# Patient Record
Sex: Female | Born: 1980 | Race: White | Hispanic: No | Marital: Married | Smoking: Former smoker
Health system: Southern US, Community
[De-identification: ages and names within clinical notes are randomized; demographics above are authoritative.]

## PROBLEM LIST (undated history)

## (undated) DIAGNOSIS — Z789 Other specified health status: Secondary | ICD-10-CM

---
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
## 2006-05-10 ENCOUNTER — Other Ambulatory Visit: Admission: RE | Admit: 2006-05-10 | Discharge: 2006-05-10 | Payer: Self-pay | Admitting: Obstetrics and Gynecology

## 2007-02-25 ENCOUNTER — Encounter: Admission: RE | Admit: 2007-02-25 | Discharge: 2007-02-25 | Payer: Self-pay | Admitting: Otolaryngology

## 2007-12-31 ENCOUNTER — Encounter: Admission: RE | Admit: 2007-12-31 | Discharge: 2007-12-31 | Payer: Self-pay | Admitting: Emergency Medicine

## 2008-05-05 ENCOUNTER — Encounter: Admission: RE | Admit: 2008-05-05 | Discharge: 2008-05-05 | Payer: Self-pay | Admitting: Emergency Medicine

## 2009-01-21 ENCOUNTER — Encounter: Admission: RE | Admit: 2009-01-21 | Discharge: 2009-01-21 | Payer: Self-pay | Admitting: Gynecology

## 2009-02-04 ENCOUNTER — Encounter: Admission: RE | Admit: 2009-02-04 | Discharge: 2009-02-04 | Payer: Self-pay | Admitting: Internal Medicine

## 2010-01-19 ENCOUNTER — Inpatient Hospital Stay (HOSPITAL_COMMUNITY): Admission: RE | Admit: 2010-01-19 | Discharge: 2010-01-21 | Payer: Self-pay | Admitting: Obstetrics and Gynecology

## 2010-10-23 ENCOUNTER — Encounter: Payer: Self-pay | Admitting: Gastroenterology

## 2010-12-19 LAB — CBC
HCT: 30.1 % — ABNORMAL LOW (ref 36.0–46.0)
HCT: 33.9 % — ABNORMAL LOW (ref 36.0–46.0)
Hemoglobin: 10.2 g/dL — ABNORMAL LOW (ref 12.0–15.0)
Hemoglobin: 11.5 g/dL — ABNORMAL LOW (ref 12.0–15.0)
MCHC: 33.8 g/dL (ref 30.0–36.0)
MCHC: 34 g/dL (ref 30.0–36.0)
MCV: 89 fL (ref 78.0–100.0)
MCV: 89.6 fL (ref 78.0–100.0)
Platelets: 201 10*3/uL (ref 150–400)
Platelets: 249 10*3/uL (ref 150–400)
RBC: 3.36 MIL/uL — ABNORMAL LOW (ref 3.87–5.11)
RBC: 3.81 MIL/uL — ABNORMAL LOW (ref 3.87–5.11)
RDW: 14.2 % (ref 11.5–15.5)
RDW: 14.4 % (ref 11.5–15.5)
WBC: 11.3 10*3/uL — ABNORMAL HIGH (ref 4.0–10.5)
WBC: 9.6 10*3/uL (ref 4.0–10.5)

## 2010-12-19 LAB — COMPREHENSIVE METABOLIC PANEL
ALT: 13 U/L (ref 0–35)
AST: 17 U/L (ref 0–37)
Albumin: 2.6 g/dL — ABNORMAL LOW (ref 3.5–5.2)
Alkaline Phosphatase: 145 U/L — ABNORMAL HIGH (ref 39–117)
BUN: 11 mg/dL (ref 6–23)
CO2: 22 mEq/L (ref 19–32)
Calcium: 9 mg/dL (ref 8.4–10.5)
Chloride: 106 mEq/L (ref 96–112)
Creatinine, Ser: 0.59 mg/dL (ref 0.4–1.2)
GFR calc Af Amer: >60 mL/min (ref >60)
GFR calc non Af Amer: >60 mL/min (ref >60)
Glucose, Bld: 87 mg/dL (ref 70–99)
Potassium: 4.3 mEq/L (ref 3.5–5.1)
Sodium: 136 mEq/L (ref 135–145)
Total Bilirubin: 0.3 mg/dL (ref 0.3–1.2)
Total Protein: 5.7 g/dL — ABNORMAL LOW (ref 6.0–8.3)

## 2010-12-19 LAB — LACTATE DEHYDROGENASE: LDH: 141 U/L (ref 94–250)

## 2010-12-19 LAB — URIC ACID: Uric Acid, Serum: 5.9 mg/dL (ref 2.4–7.0)

## 2010-12-19 LAB — RPR: RPR Ser Ql: NONREACTIVE

## 2010-12-19 LAB — CCBB MATERNAL DONOR DRAW

## 2011-10-02 NOTE — L&D Delivery Note (Signed)
Delivery Note At 12:03 AM a healthy female was delivered via Vaginal, Spontaneous Delivery (Presentation: Left Occiput Anterior).  APGAR: 8, 9; weight pending .   Placenta status: Intact, Spontaneous.  Cord: 3 vessels with the following complications: None.    Anesthesia: Epidural  Episiotomy: None Lacerations: 2nd degree;Perineal Suture Repair: 3.0 vicryl rapide Est. Blood Loss (mL): 350 cc  Mom to postpartum.  Baby to stay with mother.  Oliver Pila 08/23/2012, 12:21 AM

## 2012-02-01 LAB — OB RESULTS CONSOLE ANTIBODY SCREEN: Antibody Screen: NEGATIVE

## 2012-02-01 LAB — OB RESULTS CONSOLE RPR: RPR: NONREACTIVE

## 2012-02-01 LAB — OB RESULTS CONSOLE RUBELLA ANTIBODY, IGM: Rubella: IMMUNE

## 2012-02-01 LAB — OB RESULTS CONSOLE GC/CHLAMYDIA
Chlamydia: NEGATIVE
Gonorrhea: NEGATIVE

## 2012-02-01 LAB — OB RESULTS CONSOLE HIV ANTIBODY (ROUTINE TESTING): HIV: NONREACTIVE

## 2012-02-01 LAB — OB RESULTS CONSOLE HEPATITIS B SURFACE ANTIGEN: Hepatitis B Surface Ag: NEGATIVE

## 2012-02-01 LAB — OB RESULTS CONSOLE ABO/RH: RH Type: POSITIVE

## 2012-07-25 LAB — OB RESULTS CONSOLE GBS: GBS: POSITIVE

## 2012-08-22 ENCOUNTER — Encounter (HOSPITAL_COMMUNITY): Payer: Self-pay | Admitting: Anesthesiology

## 2012-08-22 ENCOUNTER — Encounter (HOSPITAL_COMMUNITY): Payer: Self-pay | Admitting: *Deleted

## 2012-08-22 ENCOUNTER — Inpatient Hospital Stay (HOSPITAL_COMMUNITY): Payer: 59 | Admitting: Anesthesiology

## 2012-08-22 ENCOUNTER — Inpatient Hospital Stay (HOSPITAL_COMMUNITY)
Admission: AD | Admit: 2012-08-22 | Discharge: 2012-08-24 | DRG: 775 | Disposition: A | Discharge status: Home / Self Care | Payer: 59 | Source: Ambulatory Visit | Attending: Obstetrics and Gynecology | Admitting: Obstetrics and Gynecology

## 2012-08-22 DIAGNOSIS — Z22330 Carrier of Group B streptococcus: Secondary | ICD-10-CM

## 2012-08-22 DIAGNOSIS — O99892 Other specified diseases and conditions complicating childbirth: Secondary | ICD-10-CM | POA: Diagnosis present

## 2012-08-22 HISTORY — DX: Other specified health status: Z78.9

## 2012-08-22 LAB — CBC
HCT: 33.2 % — ABNORMAL LOW (ref 36.0–46.0)
Hemoglobin: 11.2 g/dL — ABNORMAL LOW (ref 12.0–15.0)
MCH: 28.8 pg (ref 26.0–34.0)
MCHC: 33.7 g/dL (ref 30.0–36.0)
MCV: 85.3 fL (ref 78.0–100.0)
Platelets: 267 10*3/uL (ref 150–400)
RBC: 3.89 MIL/uL (ref 3.87–5.11)
RDW: 13.6 % (ref 11.5–15.5)
WBC: 14.6 10*3/uL — ABNORMAL HIGH (ref 4.0–10.5)

## 2012-08-22 MED ORDER — DIPHENHYDRAMINE HCL 50 MG/ML IJ SOLN: 12.5000 mg | INTRAMUSCULAR | Status: DC | PRN

## 2012-08-22 MED ORDER — ACETAMINOPHEN 325 MG PO TABS: 650.0000 mg | ORAL_TABLET | ORAL | Status: DC | PRN

## 2012-08-22 MED ORDER — LACTATED RINGERS IV SOLN: 500.0000 mL | INTRAVENOUS | Status: DC | PRN

## 2012-08-22 MED ORDER — FENTANYL 2.5 MCG/ML BUPIVACAINE 1/10 % EPIDURAL INFUSION (WH - ANES)
14.0000 mL/h | INTRAMUSCULAR | Status: DC
  Administered 2012-08-22: 14 mL/h via EPIDURAL
  Filled 2012-08-22: qty 125

## 2012-08-22 MED ORDER — LIDOCAINE HCL (PF) 1 % IJ SOLN
INTRAMUSCULAR | Status: DC | PRN
  Administered 2012-08-22 (×4): 4 mL

## 2012-08-22 MED ORDER — OXYTOCIN 40 UNITS IN LACTATED RINGERS INFUSION - SIMPLE MED
62.5000 mL/h | INTRAVENOUS | Status: DC
  Filled 2012-08-22: qty 1000

## 2012-08-22 MED ORDER — OXYCODONE-ACETAMINOPHEN 5-325 MG PO TABS: 1.0000 | ORAL_TABLET | ORAL | Status: DC | PRN

## 2012-08-22 MED ORDER — ONDANSETRON HCL 4 MG/2ML IJ SOLN: 4.0000 mg | Freq: Four times a day (QID) | INTRAMUSCULAR | Status: DC | PRN

## 2012-08-22 MED ORDER — EPHEDRINE 5 MG/ML INJ: 10.0000 mg | INTRAVENOUS | Status: DC | PRN

## 2012-08-22 MED ORDER — EPHEDRINE 5 MG/ML INJ
10.0000 mg | INTRAVENOUS | Status: DC | PRN
  Filled 2012-08-22: qty 4

## 2012-08-22 MED ORDER — LIDOCAINE HCL (PF) 1 % IJ SOLN
30.0000 mL | INTRAMUSCULAR | Status: DC | PRN
  Filled 2012-08-22: qty 30

## 2012-08-22 MED ORDER — DEXTROSE 5 % IV SOLN
2.5000 10*6.[IU] | INTRAVENOUS | Status: DC
  Filled 2012-08-22 (×5): qty 2.5

## 2012-08-22 MED ORDER — FLEET ENEMA 7-19 GM/118ML RE ENEM: 1.0000 | ENEMA | RECTAL | Status: DC | PRN

## 2012-08-22 MED ORDER — CITRIC ACID-SODIUM CITRATE 334-500 MG/5ML PO SOLN: 30.0000 mL | ORAL | Status: DC | PRN

## 2012-08-22 MED ORDER — PHENYLEPHRINE 40 MCG/ML (10ML) SYRINGE FOR IV PUSH (FOR BLOOD PRESSURE SUPPORT)
80.0000 ug | PREFILLED_SYRINGE | INTRAVENOUS | Status: DC | PRN
  Filled 2012-08-22: qty 5

## 2012-08-22 MED ORDER — PHENYLEPHRINE 40 MCG/ML (10ML) SYRINGE FOR IV PUSH (FOR BLOOD PRESSURE SUPPORT): 80.0000 ug | PREFILLED_SYRINGE | INTRAVENOUS | Status: DC | PRN

## 2012-08-22 MED ORDER — LACTATED RINGERS IV SOLN
500.0000 mL | Freq: Once | INTRAVENOUS | Status: AC
  Administered 2012-08-22: 21:00:00 via INTRAVENOUS

## 2012-08-22 MED ORDER — PENICILLIN G POTASSIUM 5000000 UNITS IJ SOLR
5.0000 10*6.[IU] | Freq: Once | INTRAMUSCULAR | Status: AC
  Administered 2012-08-22: 5 10*6.[IU] via INTRAVENOUS
  Filled 2012-08-22: qty 5

## 2012-08-22 MED ORDER — IBUPROFEN 600 MG PO TABS: 600.0000 mg | ORAL_TABLET | Freq: Four times a day (QID) | ORAL | Status: DC | PRN

## 2012-08-22 MED ORDER — LACTATED RINGERS IV SOLN
INTRAVENOUS | Status: DC
  Administered 2012-08-22: 21:00:00 via INTRAVENOUS

## 2012-08-22 MED ORDER — OXYTOCIN BOLUS FROM INFUSION
500.0000 mL | INTRAVENOUS | Status: DC
  Administered 2012-08-23: 500 mL via INTRAVENOUS

## 2012-08-22 NOTE — Progress Notes (Signed)
   Subjective: Pt comfortable with epidural  Objective: BP 128/75  Pulse 86  Temp 99.1 F (37.3 C) (Oral)  Resp 20  Ht 5\' 4"  (1.626 m)  Wt 90.266 kg (199 lb)  BMI 34.16 kg/m2  SpO2 100%  LMP 11/19/2011      FHT:  FHR: 140 bpm, variability: moderate,  accelerations:  Present,  decelerations:  Absent UC:   regular, every 2 minutes SVE:  C/7-8/-1 AROM clear  Labs: Lab Results  Component Value Date   WBC 14.6* 08/22/2012   HGB 11.2* 08/22/2012   HCT 33.2* 08/22/2012   MCV 85.3 08/22/2012   PLT 267 08/22/2012    Assessment / Plan: Pt progressing, will follow progress. PCN on board for +GBS   Oliver Pila 08/22/2012, 10:43 PM

## 2012-08-22 NOTE — Anesthesia Preprocedure Evaluation (Signed)

## 2012-08-22 NOTE — Anesthesia Procedure Notes (Signed)
Epidural Patient location during procedure: OB Start time: 08/22/2012 9:23 PM  Staffing Performed by: anesthesiologist   Preanesthetic Checklist Completed: patient identified, site marked, surgical consent, pre-op evaluation, timeout performed, IV checked, risks and benefits discussed and monitors and equipment checked  Epidural Patient position: sitting Prep: site prepped and draped and DuraPrep Patient monitoring: continuous pulse ox and blood pressure Approach: midline Injection technique: LOR air  Needle:  Needle type: Tuohy  Needle gauge: 17 G Needle length: 9 cm and 9 Needle insertion depth: 5 cm cm Catheter type: closed end flexible Catheter size: 19 Gauge Catheter at skin depth: 10 cm Test dose: negative  Assessment Events: blood not aspirated, injection not painful, no injection resistance, negative IV test and no paresthesia  Additional Notes Discussed risk of headache, infection, bleeding, nerve injury and failed or incomplete block.  Patient voices understanding and wishes to proceed. Reason for block:procedure for pain

## 2012-08-22 NOTE — H&P (Signed)
Virginia Carter is a 31 y.o. female G2P1001 at 39+ weeks (EDD 08/25/12 by LMP ) presenting for regular contractions and cervical change to 4cm.  Prenatal care uncomplicated except +GBS.  Maternal Medical History:  Reason for admission: Reason for admission: contractions.  Contractions: Onset was 3-5 hours ago.   Frequency: regular.   Perceived severity is strong.    Fetal activity: Perceived fetal activity is normal.      OB History    Grav Para Term Preterm Abortions TAB SAB Ect Mult Living   2 1 1       1     2011 NSVD 7#2oz  Past Medical History  Diagnosis Date  . No pertinent past medical history    Past Surgical History  Procedure Date  . Wisdom tooth extraction 2001   Family History: family history is not on file. Social History:  does not have a smoking history on file. She does not have any smokeless tobacco history on file. Her alcohol and drug histories not on file.   Prenatal Transfer Tool  Maternal Diabetes: No Genetic Screening: Normal Maternal Ultrasounds/Referrals: Normal Fetal Ultrasounds or other Referrals:  None Maternal Substance Abuse:  No Significant Maternal Medications:  None Significant Maternal Lab Results:  Lab values include: Group B Strep positive Other Comments:  None  ROS  Dilation: 4 Effacement (%): 80 Station: -2 Exam by:: Danella Deis, RNC-OB Temperature 99.1 F (37.3 C), temperature source Oral, last menstrual period 11/19/2011. Maternal Exam:  Uterine Assessment: Contraction strength is firm.  Contraction frequency is regular.   Abdomen: Patient reports no abdominal tenderness. Fetal presentation: vertex  Introitus: Normal vulva. Normal vagina.    Physical Exam  Constitutional: She is oriented to person, place, and time. She appears well-developed and well-nourished.  Cardiovascular: Normal rate and regular rhythm.   Respiratory: Effort normal and breath sounds normal.  GI: Soft. Bowel sounds are normal.  Genitourinary: Vagina  normal and uterus normal.  Neurological: She is alert and oriented to person, place, and time.  Psychiatric: She has a normal mood and affect. Her behavior is normal.    Prenatal labs: ABO, Rh: A/Positive/-- (05/03 0000) Antibody: Negative (05/03 0000) Rubella: Immune (05/03 0000) RPR: Nonreactive (05/03 0000)  HBsAg: Negative (05/03 0000)  HIV: Non-reactive (05/03 0000)  GBS: Positive (10/25 0000)  One hour GCT 138 Three hour GTT WNL First trimester screen and AFP WNL  Assessment/Plan: Pt admitted with contractions and cervical change to 4cm.  GBS positive, will get PCN prophylaxis.  Desires epidural.   Oliver Pila 08/22/2012, 8:46 PM

## 2012-08-22 NOTE — OB Triage Note (Signed)
Pt c/o ctx every 2 min for 30-40 sec since 4pm today, denies bleeding or leaking of fluid, states baby has been active. Pt states she was 2cm/50% in the office today.

## 2012-08-23 ENCOUNTER — Encounter (HOSPITAL_COMMUNITY): Payer: Self-pay | Admitting: *Deleted

## 2012-08-23 LAB — CBC
HCT: 29.7 % — ABNORMAL LOW (ref 36.0–46.0)
Hemoglobin: 9.9 g/dL — ABNORMAL LOW (ref 12.0–15.0)
MCH: 28.6 pg (ref 26.0–34.0)
MCHC: 33.3 g/dL (ref 30.0–36.0)
MCV: 85.8 fL (ref 78.0–100.0)
Platelets: 224 10*3/uL (ref 150–400)
RBC: 3.46 MIL/uL — ABNORMAL LOW (ref 3.87–5.11)
RDW: 13.7 % (ref 11.5–15.5)
WBC: 12.5 10*3/uL — ABNORMAL HIGH (ref 4.0–10.5)

## 2012-08-23 LAB — TYPE AND SCREEN
ABO/RH(D): A POS
Antibody Screen: NEGATIVE

## 2012-08-23 LAB — RPR: RPR Ser Ql: NONREACTIVE

## 2012-08-23 LAB — ABO/RH: ABO/RH(D): A POS

## 2012-08-23 MED ORDER — TETANUS-DIPHTH-ACELL PERTUSSIS 5-2.5-18.5 LF-MCG/0.5 IM SUSP: 0.5000 mL | Freq: Once | INTRAMUSCULAR | Status: DC

## 2012-08-23 MED ORDER — DIPHENHYDRAMINE HCL 25 MG PO CAPS: 25.0000 mg | ORAL_CAPSULE | Freq: Four times a day (QID) | ORAL | Status: DC | PRN

## 2012-08-23 MED ORDER — ZOLPIDEM TARTRATE 5 MG PO TABS: 5.0000 mg | ORAL_TABLET | Freq: Every evening | ORAL | Status: DC | PRN

## 2012-08-23 MED ORDER — OXYCODONE-ACETAMINOPHEN 5-325 MG PO TABS
1.0000 | ORAL_TABLET | ORAL | Status: DC | PRN
  Administered 2012-08-23 – 2012-08-24 (×2): 1 via ORAL
  Filled 2012-08-23 (×2): qty 1

## 2012-08-23 MED ORDER — LANOLIN HYDROUS EX OINT: TOPICAL_OINTMENT | CUTANEOUS | Status: DC | PRN

## 2012-08-23 MED ORDER — IBUPROFEN 600 MG PO TABS
600.0000 mg | ORAL_TABLET | Freq: Four times a day (QID) | ORAL | Status: DC
  Administered 2012-08-23 – 2012-08-24 (×5): 600 mg via ORAL
  Filled 2012-08-23 (×5): qty 1

## 2012-08-23 MED ORDER — BENZOCAINE-MENTHOL 20-0.5 % EX AERO
1.0000 "application " | INHALATION_SPRAY | CUTANEOUS | Status: DC | PRN
  Administered 2012-08-23: 1 via TOPICAL
  Filled 2012-08-23: qty 56

## 2012-08-23 MED ORDER — ONDANSETRON HCL 4 MG/2ML IJ SOLN: 4.0000 mg | INTRAMUSCULAR | Status: DC | PRN

## 2012-08-23 MED ORDER — DIBUCAINE 1 % RE OINT: 1.0000 "application " | TOPICAL_OINTMENT | RECTAL | Status: DC | PRN

## 2012-08-23 MED ORDER — PRENATAL MULTIVITAMIN CH
1.0000 | ORAL_TABLET | Freq: Every day | ORAL | Status: DC
  Administered 2012-08-23 – 2012-08-24 (×2): 1 via ORAL
  Filled 2012-08-23 (×2): qty 1

## 2012-08-23 MED ORDER — SIMETHICONE 80 MG PO CHEW: 80.0000 mg | CHEWABLE_TABLET | ORAL | Status: DC | PRN

## 2012-08-23 MED ORDER — ONDANSETRON HCL 4 MG PO TABS: 4.0000 mg | ORAL_TABLET | ORAL | Status: DC | PRN

## 2012-08-23 MED ORDER — SENNOSIDES-DOCUSATE SODIUM 8.6-50 MG PO TABS
2.0000 | ORAL_TABLET | Freq: Every day | ORAL | Status: DC
  Administered 2012-08-23: 2 via ORAL

## 2012-08-23 MED ORDER — WITCH HAZEL-GLYCERIN EX PADS: 1.0000 "application " | MEDICATED_PAD | CUTANEOUS | Status: DC | PRN

## 2012-08-23 NOTE — Clinical Social Work Note (Signed)
CSW spoke with MOB briefly, MOB reports she was on Lexapro before pregnancy and plans to restart this.  No current emotion concerns.   Patient was referred for history of depression/anxiety.  * Referral screened out by Clinical Social Worker because none of the following criteria appear to apply: ~ History of anxiety/depression during this pregnancy, or of post-partum depression. ~ Diagnosis of anxiety and/or depression within last 3 years ~ History of depression due to pregnancy loss/loss of child  OR  * Patient's symptoms currently being treated with medication and/or therapy.  Please contact the Clinical Social Worker if needs arise, or by the patient's request.

## 2012-08-23 NOTE — Anesthesia Postprocedure Evaluation (Signed)
  Anesthesia Post-op Note  Patient: Virginia Carter  Procedure(s) Performed: * No procedures listed *  Patient Location: Mother/Baby  Anesthesia Type:Epidural  Level of Consciousness: awake, alert  and oriented  Airway and Oxygen Therapy: Patient Spontanous Breathing  Post-op Pain: mild  Post-op Assessment: Patient's Cardiovascular Status Stable, Respiratory Function Stable, Patent Airway, No signs of Nausea or vomiting and Pain level controlled  Post-op Vital Signs: stable  Complications: No apparent anesthesia complications

## 2012-08-23 NOTE — Progress Notes (Signed)
Post Partum Day 0 Subjective: no complaints, up ad lib and tolerating PO  Objective: Blood pressure 103/61, pulse 87, temperature 97.7 F (36.5 C), temperature source Oral, resp. rate 18, height 5\' 4"  (1.626 m), weight 90.266 kg (199 lb), last menstrual period 11/19/2011, SpO2 100.00%.  Physical Exam:  General: alert and cooperative Lochia: appropriate Uterine Fundus: firm    Basename 08/23/12 0500 08/22/12 2030  HGB 9.9* 11.2*  HCT 29.7* 33.2*    Assessment/Plan: Plan for discharge tomorrow per pt request D/w pt circumcision in detail, desires tomorrow   LOS: 1 day   Virginia Carter W 08/23/2012, 9:34 AM

## 2012-08-24 MED ORDER — OXYCODONE-ACETAMINOPHEN 5-325 MG PO TABS: 1.0000 | ORAL_TABLET | ORAL | Status: DC | PRN

## 2012-08-24 MED ORDER — IBUPROFEN 600 MG PO TABS: 600.0000 mg | ORAL_TABLET | Freq: Four times a day (QID) | ORAL | Status: DC

## 2012-08-24 NOTE — Discharge Summary (Signed)
Obstetric Discharge Summary Reason for Admission: onset of labor Prenatal Procedures: none Intrapartum Procedures: spontaneous vaginal delivery Postpartum Procedures: none Complications-Operative and Postpartum: small second degree perineal laceration Hemoglobin  Date Value Range Status  08/23/2012 9.9* 12.0 - 15.0 g/dL Final     HCT  Date Value Range Status  08/23/2012 29.7* 36.0 - 46.0 % Final    Physical Exam:  General: alert and cooperative Lochia: appropriate Uterine Fundus: firm   Discharge Diagnoses: Term Pregnancy-delivered  Discharge Information: Date: 08/24/2012 Activity: pelvic rest Diet: routine Medications: Ibuprofen and Percocet Condition: improved Instructions: refer to practice specific booklet Discharge to: home Follow-up Information    Follow up with Virginia Pila, MD. Schedule an appointment as soon as possible for a visit in 6 weeks.   Contact information:   [address], [address] [place] [address] [phone]          Newborn Data: Live born female  Birth Weight: 8 lb 0.6 oz (3646 g) APGAR: 8, 9  Home with mother.  Virginia Carter 08/24/2012, 10:30 AM

## 2012-08-24 NOTE — Progress Notes (Signed)
Post Partum Day 2 Subjective: no complaints, up ad lib and tolerating PO  Objective: Blood pressure 120/76, pulse 66, temperature 98.1 F (36.7 C), temperature source Oral, resp. rate 18, height 5\' 4"  (1.626 m), weight 90.266 kg (199 lb), last menstrual period 11/19/2011, SpO2 100.00%.  Physical Exam:  General: alert and cooperative Lochia: appropriate Uterine Fundus: firm    Basename 08/23/12 0500 08/22/12 2030  HGB 9.9* 11.2*  HCT 29.7* 33.2*    Assessment/Plan: Discharge home Motrin and percocet   LOS: 2 days   Daelynn Blower W 08/24/2012, 10:25 AM

## 2012-08-25 ENCOUNTER — Encounter (HOSPITAL_COMMUNITY): Payer: Self-pay | Admitting: *Deleted

## 2012-08-26 ENCOUNTER — Inpatient Hospital Stay (HOSPITAL_COMMUNITY): Admission: RE | Admit: 2012-08-26 | Payer: 59 | Source: Ambulatory Visit

## 2013-02-25 ENCOUNTER — Emergency Department: Payer: Self-pay | Admitting: Internal Medicine

## 2013-03-05 ENCOUNTER — Other Ambulatory Visit (HOSPITAL_COMMUNITY): Payer: Self-pay | Admitting: Neurosurgery

## 2013-03-05 DIAGNOSIS — M5126 Other intervertebral disc displacement, lumbar region: Secondary | ICD-10-CM

## 2013-03-09 ENCOUNTER — Ambulatory Visit (HOSPITAL_COMMUNITY): Payer: 59

## 2013-03-10 ENCOUNTER — Ambulatory Visit (HOSPITAL_COMMUNITY)
Admission: RE | Admit: 2013-03-10 | Discharge: 2013-03-10 | Disposition: A | Discharge status: Home / Self Care | Payer: 59 | Source: Ambulatory Visit | Attending: Neurosurgery | Admitting: Neurosurgery

## 2013-03-10 DIAGNOSIS — M5126 Other intervertebral disc displacement, lumbar region: Secondary | ICD-10-CM

## 2013-03-10 DIAGNOSIS — M545 Low back pain, unspecified: Secondary | ICD-10-CM | POA: Insufficient documentation

## 2013-03-10 DIAGNOSIS — M5137 Other intervertebral disc degeneration, lumbosacral region: Secondary | ICD-10-CM | POA: Insufficient documentation

## 2013-03-10 DIAGNOSIS — M25559 Pain in unspecified hip: Secondary | ICD-10-CM | POA: Insufficient documentation

## 2013-03-10 DIAGNOSIS — M51379 Other intervertebral disc degeneration, lumbosacral region without mention of lumbar back pain or lower extremity pain: Secondary | ICD-10-CM | POA: Insufficient documentation

## 2013-08-07 ENCOUNTER — Ambulatory Visit: Payer: Self-pay

## 2014-08-02 ENCOUNTER — Encounter (HOSPITAL_COMMUNITY): Payer: Self-pay | Admitting: *Deleted

## 2015-12-30 DIAGNOSIS — Z0000 Encounter for general adult medical examination without abnormal findings: Secondary | ICD-10-CM | POA: Diagnosis not present

## 2016-01-06 DIAGNOSIS — Z0000 Encounter for general adult medical examination without abnormal findings: Secondary | ICD-10-CM | POA: Diagnosis not present

## 2016-01-06 DIAGNOSIS — N390 Urinary tract infection, site not specified: Secondary | ICD-10-CM | POA: Diagnosis not present

## 2016-01-06 DIAGNOSIS — K219 Gastro-esophageal reflux disease without esophagitis: Secondary | ICD-10-CM | POA: Diagnosis not present

## 2016-01-06 DIAGNOSIS — M4302 Spondylolysis, cervical region: Secondary | ICD-10-CM | POA: Diagnosis not present

## 2016-01-06 DIAGNOSIS — E663 Overweight: Secondary | ICD-10-CM | POA: Diagnosis not present

## 2016-01-06 DIAGNOSIS — R8299 Other abnormal findings in urine: Secondary | ICD-10-CM | POA: Diagnosis not present

## 2016-01-06 DIAGNOSIS — F418 Other specified anxiety disorders: Secondary | ICD-10-CM | POA: Diagnosis not present

## 2016-06-22 DIAGNOSIS — Z30432 Encounter for removal of intrauterine contraceptive device: Secondary | ICD-10-CM | POA: Diagnosis not present

## 2016-06-22 DIAGNOSIS — N906 Unspecified hypertrophy of vulva: Secondary | ICD-10-CM | POA: Diagnosis not present

## 2016-06-22 DIAGNOSIS — Z130 Encounter for screening for diseases of the blood and blood-forming organs and certain disorders involving the immune mechanism: Secondary | ICD-10-CM | POA: Diagnosis not present

## 2016-06-22 DIAGNOSIS — Z6828 Body mass index (BMI) 28.0-28.9, adult: Secondary | ICD-10-CM | POA: Diagnosis not present

## 2016-06-22 DIAGNOSIS — Z01419 Encounter for gynecological examination (general) (routine) without abnormal findings: Secondary | ICD-10-CM | POA: Diagnosis not present

## 2016-06-22 DIAGNOSIS — Z1389 Encounter for screening for other disorder: Secondary | ICD-10-CM | POA: Diagnosis not present

## 2016-06-26 DIAGNOSIS — L732 Hidradenitis suppurativa: Secondary | ICD-10-CM | POA: Diagnosis not present

## 2016-06-26 DIAGNOSIS — L810 Postinflammatory hyperpigmentation: Secondary | ICD-10-CM | POA: Diagnosis not present

## 2016-08-10 ENCOUNTER — Telehealth: Payer: 59 | Admitting: Family

## 2016-08-10 DIAGNOSIS — B9689 Other specified bacterial agents as the cause of diseases classified elsewhere: Secondary | ICD-10-CM

## 2016-08-10 DIAGNOSIS — J329 Chronic sinusitis, unspecified: Secondary | ICD-10-CM | POA: Diagnosis not present

## 2016-08-10 MED ORDER — AMOXICILLIN-POT CLAVULANATE 875-125 MG PO TABS: 1.0000 | ORAL_TABLET | Freq: Two times a day (BID) | ORAL | 0 refills | Status: AC

## 2016-08-10 NOTE — Progress Notes (Signed)

## 2016-09-14 DIAGNOSIS — H5213 Myopia, bilateral: Secondary | ICD-10-CM | POA: Diagnosis not present

## 2016-09-14 DIAGNOSIS — H52223 Regular astigmatism, bilateral: Secondary | ICD-10-CM | POA: Diagnosis not present

## 2016-12-11 DIAGNOSIS — O200 Threatened abortion: Secondary | ICD-10-CM | POA: Diagnosis not present

## 2016-12-13 DIAGNOSIS — Z3201 Encounter for pregnancy test, result positive: Secondary | ICD-10-CM | POA: Diagnosis not present

## 2016-12-21 DIAGNOSIS — O021 Missed abortion: Secondary | ICD-10-CM | POA: Diagnosis not present

## 2017-01-11 DIAGNOSIS — Z0000 Encounter for general adult medical examination without abnormal findings: Secondary | ICD-10-CM | POA: Diagnosis not present

## 2017-01-17 DIAGNOSIS — Z6828 Body mass index (BMI) 28.0-28.9, adult: Secondary | ICD-10-CM | POA: Diagnosis not present

## 2017-01-17 DIAGNOSIS — F418 Other specified anxiety disorders: Secondary | ICD-10-CM | POA: Diagnosis not present

## 2017-01-17 DIAGNOSIS — Z0000 Encounter for general adult medical examination without abnormal findings: Secondary | ICD-10-CM | POA: Diagnosis not present

## 2017-01-17 DIAGNOSIS — Z1389 Encounter for screening for other disorder: Secondary | ICD-10-CM | POA: Diagnosis not present

## 2017-01-17 DIAGNOSIS — K219 Gastro-esophageal reflux disease without esophagitis: Secondary | ICD-10-CM | POA: Diagnosis not present

## 2017-01-17 DIAGNOSIS — M4302 Spondylolysis, cervical region: Secondary | ICD-10-CM | POA: Diagnosis not present

## 2017-01-17 DIAGNOSIS — E663 Overweight: Secondary | ICD-10-CM | POA: Diagnosis not present

## 2017-01-18 DIAGNOSIS — Z0000 Encounter for general adult medical examination without abnormal findings: Secondary | ICD-10-CM | POA: Diagnosis not present

## 2017-01-24 DIAGNOSIS — N926 Irregular menstruation, unspecified: Secondary | ICD-10-CM | POA: Diagnosis not present

## 2017-01-28 DIAGNOSIS — Z319 Encounter for procreative management, unspecified: Secondary | ICD-10-CM | POA: Diagnosis not present

## 2017-03-01 DIAGNOSIS — N926 Irregular menstruation, unspecified: Secondary | ICD-10-CM | POA: Diagnosis not present

## 2017-03-05 DIAGNOSIS — N926 Irregular menstruation, unspecified: Secondary | ICD-10-CM | POA: Diagnosis not present

## 2017-03-14 DIAGNOSIS — N911 Secondary amenorrhea: Secondary | ICD-10-CM | POA: Diagnosis not present

## 2017-03-14 DIAGNOSIS — Z3201 Encounter for pregnancy test, result positive: Secondary | ICD-10-CM | POA: Diagnosis not present

## 2017-03-25 DIAGNOSIS — O26891 Other specified pregnancy related conditions, first trimester: Secondary | ICD-10-CM | POA: Diagnosis not present

## 2017-03-25 DIAGNOSIS — Z3A01 Less than 8 weeks gestation of pregnancy: Secondary | ICD-10-CM | POA: Diagnosis not present

## 2017-04-12 DIAGNOSIS — Z3689 Encounter for other specified antenatal screening: Secondary | ICD-10-CM | POA: Diagnosis not present

## 2017-04-12 DIAGNOSIS — Z1379 Encounter for other screening for genetic and chromosomal anomalies: Secondary | ICD-10-CM | POA: Diagnosis not present

## 2017-04-12 DIAGNOSIS — O99340 Other mental disorders complicating pregnancy, unspecified trimester: Secondary | ICD-10-CM | POA: Diagnosis not present

## 2017-04-12 DIAGNOSIS — O09521 Supervision of elderly multigravida, first trimester: Secondary | ICD-10-CM | POA: Diagnosis not present

## 2017-04-12 DIAGNOSIS — Z3A10 10 weeks gestation of pregnancy: Secondary | ICD-10-CM | POA: Diagnosis not present

## 2017-04-12 DIAGNOSIS — Z113 Encounter for screening for infections with a predominantly sexual mode of transmission: Secondary | ICD-10-CM | POA: Diagnosis not present

## 2017-04-24 DIAGNOSIS — Z368A Encounter for antenatal screening for other genetic defects: Secondary | ICD-10-CM | POA: Diagnosis not present

## 2017-04-26 DIAGNOSIS — Z3682 Encounter for antenatal screening for nuchal translucency: Secondary | ICD-10-CM | POA: Diagnosis not present

## 2017-04-26 DIAGNOSIS — Z3A12 12 weeks gestation of pregnancy: Secondary | ICD-10-CM | POA: Diagnosis not present

## 2017-06-13 DIAGNOSIS — Z3A19 19 weeks gestation of pregnancy: Secondary | ICD-10-CM | POA: Diagnosis not present

## 2017-06-13 DIAGNOSIS — Z363 Encounter for antenatal screening for malformations: Secondary | ICD-10-CM | POA: Diagnosis not present

## 2017-08-16 DIAGNOSIS — Z3689 Encounter for other specified antenatal screening: Secondary | ICD-10-CM | POA: Diagnosis not present

## 2017-08-16 DIAGNOSIS — O09523 Supervision of elderly multigravida, third trimester: Secondary | ICD-10-CM | POA: Diagnosis not present

## 2017-08-16 DIAGNOSIS — Z23 Encounter for immunization: Secondary | ICD-10-CM | POA: Diagnosis not present

## 2017-08-16 DIAGNOSIS — Z3A28 28 weeks gestation of pregnancy: Secondary | ICD-10-CM | POA: Diagnosis not present

## 2017-09-20 DIAGNOSIS — H52223 Regular astigmatism, bilateral: Secondary | ICD-10-CM | POA: Diagnosis not present

## 2017-09-20 DIAGNOSIS — H5213 Myopia, bilateral: Secondary | ICD-10-CM | POA: Diagnosis not present

## 2017-10-03 DIAGNOSIS — O99345 Other mental disorders complicating the puerperium: Secondary | ICD-10-CM | POA: Diagnosis not present

## 2017-10-03 DIAGNOSIS — Z3A35 35 weeks gestation of pregnancy: Secondary | ICD-10-CM | POA: Diagnosis not present

## 2017-10-03 DIAGNOSIS — Z3685 Encounter for antenatal screening for Streptococcus B: Secondary | ICD-10-CM | POA: Diagnosis not present

## 2017-10-03 DIAGNOSIS — O09523 Supervision of elderly multigravida, third trimester: Secondary | ICD-10-CM | POA: Diagnosis not present

## 2017-10-03 DIAGNOSIS — M549 Dorsalgia, unspecified: Secondary | ICD-10-CM | POA: Diagnosis not present

## 2017-10-03 DIAGNOSIS — G43909 Migraine, unspecified, not intractable, without status migrainosus: Secondary | ICD-10-CM | POA: Diagnosis not present

## 2017-10-07 DIAGNOSIS — O368193 Decreased fetal movements, unspecified trimester, fetus 3: Secondary | ICD-10-CM | POA: Diagnosis not present

## 2017-10-07 DIAGNOSIS — Z3A35 35 weeks gestation of pregnancy: Secondary | ICD-10-CM | POA: Diagnosis not present

## 2017-11-05 ENCOUNTER — Encounter (HOSPITAL_COMMUNITY): Payer: Self-pay

## 2017-11-05 ENCOUNTER — Encounter (HOSPITAL_COMMUNITY)
Admission: AD | Disposition: A | Discharge status: Home / Self Care | Payer: Self-pay | Source: Ambulatory Visit | Attending: Obstetrics and Gynecology

## 2017-11-05 ENCOUNTER — Inpatient Hospital Stay (HOSPITAL_COMMUNITY): Payer: 59 | Admitting: Anesthesiology

## 2017-11-05 ENCOUNTER — Other Ambulatory Visit: Payer: Self-pay

## 2017-11-05 ENCOUNTER — Inpatient Hospital Stay (HOSPITAL_COMMUNITY)
Admission: AD | Admit: 2017-11-05 | Discharge: 2017-11-07 | DRG: 788 | Disposition: A | Discharge status: Home / Self Care | Payer: 59 | Source: Ambulatory Visit | Attending: Obstetrics and Gynecology | Admitting: Obstetrics and Gynecology

## 2017-11-05 DIAGNOSIS — Z87891 Personal history of nicotine dependence: Secondary | ICD-10-CM | POA: Diagnosis not present

## 2017-11-05 DIAGNOSIS — O321XX0 Maternal care for breech presentation, not applicable or unspecified: Secondary | ICD-10-CM | POA: Diagnosis present

## 2017-11-05 DIAGNOSIS — Z3A39 39 weeks gestation of pregnancy: Secondary | ICD-10-CM | POA: Diagnosis not present

## 2017-11-05 DIAGNOSIS — Z98891 History of uterine scar from previous surgery: Secondary | ICD-10-CM

## 2017-11-05 LAB — TYPE AND SCREEN
ABO/RH(D): A POS
Antibody Screen: NEGATIVE

## 2017-11-05 LAB — CBC
HCT: 38.5 % (ref 36.0–46.0)
Hemoglobin: 13.4 g/dL (ref 12.0–15.0)
MCH: 31.5 pg (ref 26.0–34.0)
MCHC: 34.8 g/dL (ref 30.0–36.0)
MCV: 90.4 fL (ref 78.0–100.0)
Platelets: 254 10*3/uL (ref 150–400)
RBC: 4.26 MIL/uL (ref 3.87–5.11)
RDW: 13.7 % (ref 11.5–15.5)
WBC: 10.5 10*3/uL (ref 4.0–10.5)

## 2017-11-05 SURGERY — Surgical Case
Anesthesia: Spinal | Site: Abdomen | Wound class: Clean Contaminated

## 2017-11-05 MED ORDER — OXYCODONE HCL 5 MG PO TABS
5.0000 mg | ORAL_TABLET | ORAL | Status: DC | PRN
  Administered 2017-11-06: 5 mg via ORAL
  Filled 2017-11-05: qty 1

## 2017-11-05 MED ORDER — PRENATAL MULTIVITAMIN CH
1.0000 | ORAL_TABLET | Freq: Every day | ORAL | Status: DC
  Administered 2017-11-06 – 2017-11-07 (×2): 1 via ORAL
  Filled 2017-11-05 (×2): qty 1

## 2017-11-05 MED ORDER — OXYTOCIN 10 UNIT/ML IJ SOLN
INTRAVENOUS | Status: DC | PRN
  Administered 2017-11-05: 40 [IU] via INTRAVENOUS

## 2017-11-05 MED ORDER — FAMOTIDINE IN NACL 20-0.9 MG/50ML-% IV SOLN
20.0000 mg | Freq: Once | INTRAVENOUS | Status: AC
  Administered 2017-11-05: 20 mg via INTRAVENOUS

## 2017-11-05 MED ORDER — LACTATED RINGERS IV SOLN
INTRAVENOUS | Status: DC
  Administered 2017-11-05: 12:00:00 via INTRAVENOUS

## 2017-11-05 MED ORDER — TETANUS-DIPHTH-ACELL PERTUSSIS 5-2.5-18.5 LF-MCG/0.5 IM SUSP: 0.5000 mL | Freq: Once | INTRAMUSCULAR | Status: DC

## 2017-11-05 MED ORDER — PHENYLEPHRINE 8 MG IN D5W 100 ML (0.08MG/ML) PREMIX OPTIME
INJECTION | INTRAVENOUS | Status: DC | PRN
  Administered 2017-11-05: 60 ug/min via INTRAVENOUS

## 2017-11-05 MED ORDER — FENTANYL CITRATE (PF) 100 MCG/2ML IJ SOLN
INTRAMUSCULAR | Status: AC
  Filled 2017-11-05: qty 2

## 2017-11-05 MED ORDER — NALBUPHINE HCL 10 MG/ML IJ SOLN
5.0000 mg | INTRAMUSCULAR | Status: DC | PRN
  Administered 2017-11-05 – 2017-11-06 (×2): 5 mg via INTRAVENOUS
  Filled 2017-11-05 (×2): qty 1

## 2017-11-05 MED ORDER — IBUPROFEN 600 MG PO TABS
600.0000 mg | ORAL_TABLET | Freq: Four times a day (QID) | ORAL | Status: DC
  Administered 2017-11-05 – 2017-11-07 (×8): 600 mg via ORAL
  Filled 2017-11-05 (×8): qty 1

## 2017-11-05 MED ORDER — PROMETHAZINE HCL 25 MG/ML IJ SOLN: 6.2500 mg | INTRAMUSCULAR | Status: DC | PRN

## 2017-11-05 MED ORDER — WITCH HAZEL-GLYCERIN EX PADS: 1.0000 "application " | MEDICATED_PAD | CUTANEOUS | Status: DC | PRN

## 2017-11-05 MED ORDER — PHENYLEPHRINE 8 MG IN D5W 100 ML (0.08MG/ML) PREMIX OPTIME
INJECTION | INTRAVENOUS | Status: AC
  Filled 2017-11-05: qty 100

## 2017-11-05 MED ORDER — ONDANSETRON HCL 4 MG/2ML IJ SOLN: 4.0000 mg | Freq: Three times a day (TID) | INTRAMUSCULAR | Status: DC | PRN

## 2017-11-05 MED ORDER — DIPHENHYDRAMINE HCL 50 MG/ML IJ SOLN: 12.5000 mg | INTRAMUSCULAR | Status: DC | PRN

## 2017-11-05 MED ORDER — SENNOSIDES-DOCUSATE SODIUM 8.6-50 MG PO TABS
2.0000 | ORAL_TABLET | ORAL | Status: DC
  Administered 2017-11-05 – 2017-11-07 (×2): 2 via ORAL
  Filled 2017-11-05 (×2): qty 2

## 2017-11-05 MED ORDER — SCOPOLAMINE 1 MG/3DAYS TD PT72: 1.0000 | MEDICATED_PATCH | Freq: Once | TRANSDERMAL | Status: DC

## 2017-11-05 MED ORDER — SIMETHICONE 80 MG PO CHEW
80.0000 mg | CHEWABLE_TABLET | ORAL | Status: DC
  Administered 2017-11-05 – 2017-11-07 (×2): 80 mg via ORAL
  Filled 2017-11-05 (×2): qty 1

## 2017-11-05 MED ORDER — DEXAMETHASONE SODIUM PHOSPHATE 4 MG/ML IJ SOLN
INTRAMUSCULAR | Status: DC | PRN
  Administered 2017-11-05: 5 mg via INTRAVENOUS

## 2017-11-05 MED ORDER — COCONUT OIL OIL
1.0000 "application " | TOPICAL_OIL | Status: DC | PRN
  Filled 2017-11-05: qty 120

## 2017-11-05 MED ORDER — DIBUCAINE 1 % RE OINT: 1.0000 "application " | TOPICAL_OINTMENT | RECTAL | Status: DC | PRN

## 2017-11-05 MED ORDER — DIPHENHYDRAMINE HCL 12.5 MG/5ML PO ELIX
12.5000 mg | ORAL_SOLUTION | Freq: Three times a day (TID) | ORAL | Status: DC | PRN
  Administered 2017-11-05: 12.5 mg via ORAL
  Filled 2017-11-05 (×2): qty 5

## 2017-11-05 MED ORDER — DIPHENHYDRAMINE HCL 25 MG PO CAPS
25.0000 mg | ORAL_CAPSULE | ORAL | Status: DC | PRN
  Filled 2017-11-05: qty 1

## 2017-11-05 MED ORDER — CEFAZOLIN SODIUM-DEXTROSE 2-4 GM/100ML-% IV SOLN
2.0000 g | INTRAVENOUS | Status: AC
Start: 2017-11-05 — End: 2017-11-05
  Administered 2017-11-05: 2 g via INTRAVENOUS
  Filled 2017-11-05: qty 100

## 2017-11-05 MED ORDER — FENTANYL CITRATE (PF) 100 MCG/2ML IJ SOLN
25.0000 ug | INTRAMUSCULAR | Status: DC | PRN
  Administered 2017-11-05: 25 ug via INTRAVENOUS

## 2017-11-05 MED ORDER — NALOXONE HCL 4 MG/10ML IJ SOLN
1.0000 ug/kg/h | INTRAVENOUS | Status: DC | PRN
  Filled 2017-11-05: qty 5

## 2017-11-05 MED ORDER — ONDANSETRON HCL 4 MG/2ML IJ SOLN
INTRAMUSCULAR | Status: DC | PRN
  Administered 2017-11-05: 4 mg via INTRAVENOUS

## 2017-11-05 MED ORDER — NALBUPHINE HCL 10 MG/ML IJ SOLN
5.0000 mg | Freq: Once | INTRAMUSCULAR | Status: AC | PRN
  Administered 2017-11-05: 5 mg via INTRAVENOUS

## 2017-11-05 MED ORDER — SCOPOLAMINE 1 MG/3DAYS TD PT72
MEDICATED_PATCH | TRANSDERMAL | Status: DC | PRN
  Administered 2017-11-05: 1 via TRANSDERMAL

## 2017-11-05 MED ORDER — FENTANYL CITRATE (PF) 100 MCG/2ML IJ SOLN
INTRAMUSCULAR | Status: DC | PRN
  Administered 2017-11-05: 10 ug via INTRATHECAL

## 2017-11-05 MED ORDER — SOD CITRATE-CITRIC ACID 500-334 MG/5ML PO SOLN
30.0000 mL | Freq: Once | ORAL | Status: AC
  Administered 2017-11-05: 30 mL via ORAL

## 2017-11-05 MED ORDER — DEXAMETHASONE SODIUM PHOSPHATE 10 MG/ML IJ SOLN
INTRAMUSCULAR | Status: AC
  Filled 2017-11-05: qty 1

## 2017-11-05 MED ORDER — LACTATED RINGERS IV SOLN
INTRAVENOUS | Status: DC
  Administered 2017-11-05 (×2): via INTRAVENOUS

## 2017-11-05 MED ORDER — NALBUPHINE HCL 10 MG/ML IJ SOLN
5.0000 mg | INTRAMUSCULAR | Status: DC | PRN
  Administered 2017-11-06: 5 mg via SUBCUTANEOUS
  Filled 2017-11-05: qty 1

## 2017-11-05 MED ORDER — SODIUM CHLORIDE 0.9 % IR SOLN
Status: DC | PRN
  Administered 2017-11-05: 500 mL

## 2017-11-05 MED ORDER — NALBUPHINE HCL 10 MG/ML IJ SOLN: 5.0000 mg | Freq: Once | INTRAMUSCULAR | Status: AC | PRN

## 2017-11-05 MED ORDER — NALOXONE HCL 0.4 MG/ML IJ SOLN: 0.4000 mg | INTRAMUSCULAR | Status: DC | PRN

## 2017-11-05 MED ORDER — OXYCODONE HCL 5 MG PO TABS: 10.0000 mg | ORAL_TABLET | ORAL | Status: DC | PRN

## 2017-11-05 MED ORDER — SCOPOLAMINE 1 MG/3DAYS TD PT72
MEDICATED_PATCH | TRANSDERMAL | Status: AC
  Filled 2017-11-05: qty 1

## 2017-11-05 MED ORDER — BUPIVACAINE IN DEXTROSE 0.75-8.25 % IT SOLN
INTRATHECAL | Status: DC | PRN
  Administered 2017-11-05: 12 mL via INTRATHECAL

## 2017-11-05 MED ORDER — SIMETHICONE 80 MG PO CHEW
80.0000 mg | CHEWABLE_TABLET | Freq: Three times a day (TID) | ORAL | Status: DC
  Administered 2017-11-05 – 2017-11-07 (×5): 80 mg via ORAL
  Filled 2017-11-05 (×5): qty 1

## 2017-11-05 MED ORDER — MENTHOL 3 MG MT LOZG: 1.0000 | LOZENGE | OROMUCOSAL | Status: DC | PRN

## 2017-11-05 MED ORDER — ONDANSETRON HCL 4 MG/2ML IJ SOLN
INTRAMUSCULAR | Status: AC
  Filled 2017-11-05: qty 2

## 2017-11-05 MED ORDER — FAMOTIDINE IN NACL 20-0.9 MG/50ML-% IV SOLN
INTRAVENOUS | Status: AC
  Filled 2017-11-05: qty 50

## 2017-11-05 MED ORDER — MORPHINE SULFATE (PF) 0.5 MG/ML IJ SOLN
INTRAMUSCULAR | Status: AC
Start: 2017-11-05 — End: 2017-11-05
  Filled 2017-11-05: qty 10

## 2017-11-05 MED ORDER — SODIUM CHLORIDE 0.9% FLUSH: 3.0000 mL | INTRAVENOUS | Status: DC | PRN

## 2017-11-05 MED ORDER — ZOLPIDEM TARTRATE 5 MG PO TABS: 5.0000 mg | ORAL_TABLET | Freq: Every evening | ORAL | Status: DC | PRN

## 2017-11-05 MED ORDER — SOD CITRATE-CITRIC ACID 500-334 MG/5ML PO SOLN
ORAL | Status: AC
  Administered 2017-11-05: 30 mL via ORAL
  Filled 2017-11-05: qty 15

## 2017-11-05 MED ORDER — SIMETHICONE 80 MG PO CHEW: 80.0000 mg | CHEWABLE_TABLET | ORAL | Status: DC | PRN

## 2017-11-05 MED ORDER — MORPHINE SULFATE (PF) 0.5 MG/ML IJ SOLN
INTRAMUSCULAR | Status: DC | PRN
  Administered 2017-11-05: .2 mg via INTRATHECAL

## 2017-11-05 MED ORDER — DIPHENHYDRAMINE HCL 25 MG PO CAPS: 25.0000 mg | ORAL_CAPSULE | Freq: Four times a day (QID) | ORAL | Status: DC | PRN

## 2017-11-05 MED ORDER — MEPERIDINE HCL 25 MG/ML IJ SOLN: 6.2500 mg | INTRAMUSCULAR | Status: DC | PRN

## 2017-11-05 MED ORDER — OXYTOCIN 40 UNITS IN LACTATED RINGERS INFUSION - SIMPLE MED: 2.5000 [IU]/h | INTRAVENOUS | Status: AC

## 2017-11-05 MED ORDER — LACTATED RINGERS IV SOLN: INTRAVENOUS | Status: DC

## 2017-11-05 MED ORDER — NALBUPHINE HCL 10 MG/ML IJ SOLN
INTRAMUSCULAR | Status: AC
  Administered 2017-11-05: 5 mg via INTRAVENOUS
  Filled 2017-11-05: qty 1

## 2017-11-05 SURGICAL SUPPLY — 35 items
BENZOIN TINCTURE PRP APPL 2/3 (GAUZE/BANDAGES/DRESSINGS) ×2 IMPLANT
CHLORAPREP W/TINT 26ML (MISCELLANEOUS) ×2 IMPLANT
CLAMP CORD UMBIL (MISCELLANEOUS) IMPLANT
CLOSURE STERI STRIP 1/2 X4 (GAUZE/BANDAGES/DRESSINGS) ×2 IMPLANT
CLOTH BEACON ORANGE TIMEOUT ST (SAFETY) ×2 IMPLANT
DRAPE C SECTION CLR SCREEN (DRAPES) ×2 IMPLANT
DRSG OPSITE POSTOP 4X10 (GAUZE/BANDAGES/DRESSINGS) ×2 IMPLANT
ELECT REM PT RETURN 9FT ADLT (ELECTROSURGICAL) ×2
ELECTRODE REM PT RTRN 9FT ADLT (ELECTROSURGICAL) ×1 IMPLANT
EXTRACTOR VACUUM KIWI (MISCELLANEOUS) IMPLANT
GLOVE BIO SURGEON STRL SZ 6.5 (GLOVE) ×2 IMPLANT
GLOVE BIOGEL PI IND STRL 7.0 (GLOVE) ×2 IMPLANT
GLOVE BIOGEL PI INDICATOR 7.0 (GLOVE) ×2
GOWN STRL REUS W/TWL LRG LVL3 (GOWN DISPOSABLE) ×4 IMPLANT
KIT ABG SYR 3ML LUER SLIP (SYRINGE) IMPLANT
NEEDLE HYPO 25X5/8 SAFETYGLIDE (NEEDLE) IMPLANT
NS IRRIG 1000ML POUR BTL (IV SOLUTION) ×2 IMPLANT
PACK C SECTION WH (CUSTOM PROCEDURE TRAY) ×2 IMPLANT
PAD OB MATERNITY 4.3X12.25 (PERSONAL CARE ITEMS) ×2 IMPLANT
RETRACTOR WND ALEXIS 25 LRG (MISCELLANEOUS) ×1 IMPLANT
RTRCTR C-SECT PINK 25CM LRG (MISCELLANEOUS) IMPLANT
RTRCTR WOUND ALEXIS 25CM LRG (MISCELLANEOUS) ×2
STRIP CLOSURE SKIN 1/2X4 (GAUZE/BANDAGES/DRESSINGS) ×2 IMPLANT
SUT CHROMIC 1 CTX 36 (SUTURE) ×4 IMPLANT
SUT PLAIN 0 NONE (SUTURE) IMPLANT
SUT PLAIN 2 0 XLH (SUTURE) ×2 IMPLANT
SUT VIC AB 0 CT1 27 (SUTURE) ×2
SUT VIC AB 0 CT1 27XBRD ANBCTR (SUTURE) ×2 IMPLANT
SUT VIC AB 2-0 CT1 27 (SUTURE) ×1
SUT VIC AB 2-0 CT1 TAPERPNT 27 (SUTURE) ×1 IMPLANT
SUT VIC AB 3-0 CT1 27 (SUTURE)
SUT VIC AB 3-0 CT1 TAPERPNT 27 (SUTURE) IMPLANT
SUT VIC AB 4-0 KS 27 (SUTURE) ×2 IMPLANT
TOWEL OR 17X24 6PK STRL BLUE (TOWEL DISPOSABLE) ×2 IMPLANT
TRAY FOLEY BAG SILVER LF 14FR (SET/KITS/TRAYS/PACK) ×2 IMPLANT

## 2017-11-05 NOTE — Anesthesia Procedure Notes (Signed)
Spinal  Patient location during procedure: OR Start time: 11/05/2017 11:21 AM End time: 11/05/2017 11:31 AM Staffing Anesthesiologist: Heather RobertsSinger, Cicilia Clinger, MD Performed: anesthesiologist  Preanesthetic Checklist Completed: patient identified, surgical consent, pre-op evaluation, timeout performed, IV checked, risks and benefits discussed and monitors and equipment checked Spinal Block Patient position: sitting Prep: DuraPrep Patient monitoring: cardiac monitor, continuous pulse ox and blood pressure Approach: midline Location: L2-3 Injection technique: single-shot Needle Needle type: Pencan  Needle gauge: 24 G Needle length: 9 cm Additional Notes Functioning IV was confirmed and monitors were applied. Sterile prep and drape, including hand hygiene and sterile gloves were used. The patient was positioned and the spine was prepped. The skin was anesthetized with lidocaine.  Free flow of clear CSF was obtained prior to injecting local anesthetic into the CSF.  The spinal needle aspirated freely following injection.  The needle was carefully withdrawn.  The patient tolerated the procedure well.

## 2017-11-05 NOTE — Anesthesia Preprocedure Evaluation (Addendum)
Anesthesia Evaluation  Patient identified by MRN, date of birth, ID band Patient awake    Reviewed: Allergy & Precautions, NPO status , Patient's Chart, lab work & pertinent test results  History of Anesthesia Complications Negative for: history of anesthetic complications  Airway Mallampati: II  TM Distance: >3 FB Neck ROM: Full    Dental no notable dental hx. (+) Dental Advisory Given   Pulmonary former smoker,    Pulmonary exam normal        Cardiovascular negative cardio ROS Normal cardiovascular exam     Neuro/Psych negative neurological ROS  negative psych ROS   GI/Hepatic negative GI ROS, Neg liver ROS,   Endo/Other  negative endocrine ROS  Renal/GU negative Renal ROS  negative genitourinary   Musculoskeletal negative musculoskeletal ROS (+)   Abdominal   Peds negative pediatric ROS (+)  Hematology negative hematology ROS (+)   Anesthesia Other Findings   Reproductive/Obstetrics (+) Pregnancy                            Anesthesia Physical Anesthesia Plan  ASA: II  Anesthesia Plan: Spinal   Post-op Pain Management:    Induction:   PONV Risk Score and Plan: 3 and Ondansetron, Dexamethasone and Scopolamine patch - Pre-op  Airway Management Planned: Natural Airway  Additional Equipment:   Intra-op Plan:   Post-operative Plan:   Informed Consent: I have reviewed the patients History and Physical, chart, labs and discussed the procedure including the risks, benefits and alternatives for the proposed anesthesia with the patient or authorized representative who has indicated his/her understanding and acceptance.   Dental advisory given  Plan Discussed with: Anesthesiologist and CRNA  Anesthesia Plan Comments:        Anesthesia Quick Evaluation

## 2017-11-05 NOTE — Addendum Note (Signed)
Addendum  created 11/05/17 1746 by Earmon PhoenixWilkerson, Fotini Lemus P, CRNA   Sign clinical note

## 2017-11-05 NOTE — MAU Note (Signed)
Urine in lab with culture tube 

## 2017-11-05 NOTE — MAU Note (Signed)
Pt states UCs started yesterday am.  After evening walk at approx 1900 pain has intermittently increased in frequency & intensity.  Denies LOF or bag bleeding.  States +FM.

## 2017-11-05 NOTE — Op Note (Addendum)
Operative Note    Preoperative Diagnosis: IUP at 39 6/7wks                                              Active labor                                             Breech   Postoperative Diagnosis: Same   Procedure: Primary low tranverse cesarean section with double layered closure   Surgeon: Britt Bottom; DO  Anesthesia: Spinal  Fluids: LR EBL: UOP:  Findings: Viable female infant in frank breech position with head in maternal LUQ   Specimen: Placenta to L/D   Procedure Note Patient was taken to the operating room where spinal anesthesia was administered and  found to be adequate by Allis clamp test. She was prepped and draped in the normal sterile fashion in the dorsal supine position with a leftward tilt. An appropriate time out was performed. A Pfannenstiel skin incision was then made with the scalpel two finger breaths above the pubic symphysis and carried through to the underlying layer of fascia by sharp dissection and Bovie cautery. The fascia was nicked in the midline and the incision was extended laterally with Mayo scissors. The inferior aspect of the incision was grasped Coker clamps and dissected off the underlying rectus muscles. In a similar fashion the superior aspect was dissected off the rectus muscles. Rectus muscles were separated in the midline and the peritoneal cavity entered bluntly. The peritoneal incision was then extended both superiorly and inferiorly with careful attention to avoid both bowel and bladder. The Alexis self-retaining wound retractor was then placed within the incision and the lower uterine segment exposed. The bladder flap was developed with Metzenbaum scissors and pushed away from the lower uterine segment. The lower uterine segment was then incised in a transverse fashion and the cavity itself entered bluntly. The incision was extended bluntly. The infant was delivered frank breech to the level of the scapula then both arms were  swept across the chest and rotated to deliver; infants head was delivered next using smelly vite technique and loose nuchal reduced over infants head.  The infant was handed off to the waiting NICU staff. The placenta was then spontaneously expressed from the uterus and the uterus cleared of all clots and debris with moist lap sponge. The uterine incision was then repaired in 2 layers the first layer was a running locked layer 1-0 chromic and the second an imbricating layer of the same suture. The tubes and ovaries were inspected and the gutters cleared of all clots and debris. The uterine incision was inspected and found to be hemostatic. All instruments and sponges as well as the Alexis retractor were then removed from the abdomen. The rectus muscles and peritoneum were then reapproximated with several interrupted mattress sutures of 2-0 Vicryl. The fascia was then closed with 0 Vicryl in a running fashion. Subcutaneous tissue was reapproximated with 3-0 plain in a running fashion. The skin was closed with a subcuticular stitch of 4-0 Vicryl on a Keith needle and then reinforced with benzoin and Steri-Strips. At the conclusion of the procedure all instruments and sponge counts were correct. Patient was taken to the recovery room in good  condition with her baby accompanying her skin to skin.  Apgars 9, 9 ; weight pending

## 2017-11-05 NOTE — H&P (Signed)
Virginia Carter is a 37 y.o. female presenting for painful contractions that begun overnight. Pt noted to be 5-6cm dil but baby now breech ( confirmed via bedside US). Pt counseled and offered primary c/s with maternal head in LUQ. R?B reviewed and all questions answered Pt is dated by a 7 week US. Nl genetic and carrier screening. Hx chronic back pain - saw chiropractor during prenatal care; also hx migraines managed with fioricet. Hx anxiety/depression - stable OB History    Gravida Para Term Preterm AB Living   4 2 2   1 2    SAB TAB Ectopic Multiple Live Births   1       2     Past Medical History:  Diagnosis Date  . No pertinent past medical history    Past Surgical History:  Procedure Laterality Date  . WISDOM TOOTH EXTRACTION  2001   Family History: family history is not on file. Social History:  reports that she has quit smoking. she has never used smokeless tobacco. She reports that she does not drink alcohol or use drugs.     Maternal Diabetes: No Genetic Screening: Normal Maternal Ultrasounds/Referrals: Normal Fetal Ultrasounds or other Referrals:  Other: breech Maternal Substance Abuse:  No Significant Maternal Medications:  Meds include: Other: see hpi Significant Maternal Lab Results:  None Other Comments:  None  Review of Systems  Constitutional: Positive for malaise/fatigue. Negative for chills, fever and weight loss.  Eyes: Negative for blurred vision.  Respiratory: Negative for shortness of breath.   Cardiovascular: Negative for chest pain.  Gastrointestinal: Positive for abdominal pain. Negative for heartburn, nausea and vomiting.  Genitourinary: Negative for dysuria.  Musculoskeletal: Negative for back pain.  Skin: Negative for itching and rash.  Neurological: Negative for dizziness.  Endo/Heme/Allergies: Does not bruise/bleed easily.  Psychiatric/Behavioral: Negative for depression, hallucinations, substance abuse and suicidal ideas. The patient is  nervous/anxious.    Maternal Medical History:  Reason for admission: Contractions.  Nausea. Labor, breech  Contractions: Onset was 13-24 hours ago.   Frequency: regular.   Perceived severity is moderate.    Fetal activity: Perceived fetal activity is normal.   Last perceived fetal movement was within the past hour.    Prenatal complications: no prenatal complications Prenatal Complications - Diabetes: none.    Dilation: 5.5 Effacement (%): 80 Station: -3 Exam by:: Virginia Carter Blood pressure (!) 138/95, pulse 81, height 5\' 4"  (1.626 m), weight 196 lb (88.9 kg). Maternal Exam:  Uterine Assessment: Contraction strength is moderate.  Contraction frequency is regular.   Abdomen: Patient reports generalized tenderness.  Estimated fetal weight is AGA.   Fetal presentation: breech  Introitus: Normal vulva. Vulva is negative for condylomata and lesion.  Normal vagina.  Vagina is negative for condylomata.  Pelvis: of concern for delivery.   Cervix: Cervix evaluated by digital exam.     Physical Exam  Constitutional: She is oriented to person, place, and time. She appears well-developed and well-nourished.  Eyes: Pupils are equal, round, and reactive to light.  Neck: Normal range of motion.  Cardiovascular: Normal rate.  Respiratory: Effort normal.  GI: Soft. There is generalized tenderness.  Genitourinary: Vagina normal and uterus normal. Vulva exhibits no lesion.  Musculoskeletal: Normal range of motion.  Neurological: She is alert and oriented to person, place, and time.  Skin: Skin is warm.  Psychiatric: She has a normal mood and affect. Her behavior is normal. Judgment and thought content normal.    Prenatal labs: ABO, Rh:  Antibody:   Rubella:   RPR:    HBsAg:    HIV:    GBS:     Assessment/Plan: [id number] at 62 6/[redacted]wks gestation in frank breech presentation and in active labor Cervix 5-6cm Cat 1 strip Pt to OR for primary cesarean section for breech  position R/B/A reviewed and all questions answered To OR when ready  Virginia Carter 11/05/2017, 10:33 AM

## 2017-11-05 NOTE — Transfer of Care (Signed)
Immediate Anesthesia Transfer of Care Note  Patient: Virginia Carter  Procedure(s) Performed: CESAREAN SECTION (N/A Abdomen)  Patient Location: PACU  Anesthesia Type:Spinal  Level of Consciousness: awake and alert   Airway & Oxygen Therapy: Patient Spontanous Breathing  Post-op Assessment: Report given to RN  Post vital signs: Reviewed and stable  Last Vitals:  Vitals:   11/05/17 1223 11/05/17 1224  BP: (!) 104/55   Pulse:  69  Resp:  10  SpO2:  98%    Last Pain:  Vitals:   11/05/17 0939  PainSc: 6          Complications: No apparent anesthesia complications

## 2017-11-05 NOTE — Anesthesia Postprocedure Evaluation (Signed)
Anesthesia Post Note  Patient: Virginia Carter  Procedure(s) Performed: CESAREAN SECTION (N/A Abdomen)     Patient location during evaluation: Mother Baby Anesthesia Type: Spinal Level of consciousness: awake Pain management: pain level controlled Vital Signs Assessment: post-procedure vital signs reviewed and stable Respiratory status: spontaneous breathing Cardiovascular status: stable Postop Assessment: patient able to bend at knees and spinal receding Anesthetic complications: no    Last Vitals:  Vitals:   11/05/17 1545 11/05/17 1700  BP: 130/73 130/72  Pulse: 92 64  Resp: 18 20  Temp: 37.1 C 36.6 C  SpO2: 99% 98%    Last Pain:  Vitals:   11/05/17 1700  TempSrc: Oral  PainSc: 4    Pain Goal: Patients Stated Pain Goal: 2 (11/05/17 1446)               Edison PaceWILKERSON,Sondi Desch

## 2017-11-05 NOTE — MAU Note (Signed)
OR coordinator; house coverage notified of possible c/s for malpresentation; Dr Mindi SlickerBanga coming in to assist

## 2017-11-05 NOTE — Anesthesia Postprocedure Evaluation (Signed)
Anesthesia Post Note  Patient: Virginia Carter  Procedure(s) Performed: CESAREAN SECTION (N/A Abdomen)     Patient location during evaluation: PACU Anesthesia Type: Spinal Level of consciousness: awake and alert Pain management: pain level controlled Vital Signs Assessment: post-procedure vital signs reviewed and stable Respiratory status: spontaneous breathing and respiratory function stable Cardiovascular status: blood pressure returned to baseline and stable Postop Assessment: spinal receding Anesthetic complications: no    Last Vitals:  Vitals:   11/05/17 1300 11/05/17 1315  BP: 118/69 124/73  Pulse: 81 75  Resp: 17 18  Temp:    SpO2: 100% 100%    Last Pain:  Vitals:   11/05/17 1315  TempSrc:   PainSc: 0-No pain   Pain Goal:                 Lavonn Maxcy DANIEL

## 2017-11-05 NOTE — Progress Notes (Signed)
Patient ID: Virginia Carter, female   DOB: 12/05/1980, 37 y.o.   MRN: [id number] Pt doing well. C.o itching all over - got some relief with nubain. Pain well controlled. Bonding well with baby  A/P: POD#0 s/p pltc/s for breech - stable         Benadryl for itching         Routine postop care

## 2017-11-06 LAB — CBC
HCT: 31 % — ABNORMAL LOW (ref 36.0–46.0)
Hemoglobin: 10.9 g/dL — ABNORMAL LOW (ref 12.0–15.0)
MCH: 31.4 pg (ref 26.0–34.0)
MCHC: 35.2 g/dL (ref 30.0–36.0)
MCV: 89.3 fL (ref 78.0–100.0)
Platelets: 188 10*3/uL (ref 150–400)
RBC: 3.47 MIL/uL — ABNORMAL LOW (ref 3.87–5.11)
RDW: 13.2 % (ref 11.5–15.5)
WBC: 13.2 10*3/uL — ABNORMAL HIGH (ref 4.0–10.5)

## 2017-11-06 LAB — RPR: RPR Ser Ql: NONREACTIVE

## 2017-11-06 LAB — BIRTH TISSUE RECOVERY COLLECTION (PLACENTA DONATION)

## 2017-11-06 NOTE — Progress Notes (Addendum)
Subjective: Postpartum Day 1: Cesarean Delivery Patient reports tolerating PO and no problems voiding.  Incisional pain a little worse today, only taken ibuprofen thus far  Objective: Vital signs in last 24 hours: Temp:  [97.2 F (36.2 C)-98.7 F (37.1 C)] 98.5 F (36.9 C) (02/06 0345) Pulse Rate:  [52-92] 59 (02/06 0345) Resp:  [10-20] 16 (02/06 0345) BP: (94-146)/(55-95) 107/60 (02/06 0345) SpO2:  [92 %-100 %] 98 % (02/06 [id number])  Physical Exam:  General: alert and cooperative Lochia: appropriate Uterine Fundus: firm Incision: C/D/I    Recent Labs    11/05/17 1053 11/06/17 0532  HGB 13.4 10.9*  HCT 38.5 31.0*    Assessment/Plan: Status post Cesarean section. Doing well postoperatively.  Continue current care.  Virginia Carter 11/06/2017, 9:16 AM

## 2017-11-06 NOTE — Progress Notes (Signed)
MOB was referred for history of depression/anxiety. * Referral screened out by Clinical Social Worker because none of the following criteria appear to apply: ~ History of anxiety/depression during this pregnancy, or of post-partum depression. ~ Diagnosis of anxiety and/or depression within last 3 years OR * MOB's symptoms currently being treated with medication and/or therapy. Please contact the Clinical Social Worker if needs arise, by MOB request, or if MOB scores greater than 9/yes to question 10 on Edinburgh Postpartum Depression Screen.  MOB's chart notes Anx/Dep is "stable."   

## 2017-11-06 NOTE — Lactation Note (Signed)
Lactation Consultation Note  Patient Name: Virginia Carter Today's Date: 11/06/2017 Reason for consult: Initial assessment;Term Breastfeeding consultation services and support information given and reviewed.  Mom had a poor breastfeeding experience with first baby and pumped for 8 weeks with second.  Mom has some anxiety related to this.  She states baby latches and then falls asleep.  Assisted with latching baby to breast in football hold.  After a few attempts baby latched well.  Mom felt initial latch on pain which subsided as feeding progressed.  Baby responded well to stimulation and good active sucking observed.  Questions answered.  Encouraged to call for assist/concerns prn.  Maternal Data Has patient been taught Hand Expression?: Yes Does the patient have breastfeeding experience prior to this delivery?: Yes  Feeding Feeding Type: Breast Fed  LATCH Score Latch: Grasps breast easily, tongue down, lips flanged, rhythmical sucking.  Audible Swallowing: A few with stimulation  Type of Nipple: Everted at rest and after stimulation  Comfort (Breast/Nipple): Soft / non-tender  Hold (Positioning): Assistance needed to correctly position infant at breast and maintain latch.  LATCH Score: 8  Interventions Interventions: Breast feeding basics reviewed;Assisted with latch;Breast compression;Skin to skin;Adjust position;Breast massage;Support pillows;Hand express;Position options  Lactation Tools Discussed/Used     Consult Status Consult Status: Follow-up Date: 11/07/17 Follow-up type: In-patient    Huston FoleyMOULDEN, Shaquia Berkley S 11/06/2017, 2:18 PM

## 2017-11-07 MED ORDER — IBUPROFEN 600 MG PO TABS: 600.0000 mg | ORAL_TABLET | Freq: Four times a day (QID) | ORAL | 0 refills | Status: DC

## 2017-11-07 MED ORDER — OXYCODONE HCL 5 MG PO TABS: 5.0000 mg | ORAL_TABLET | ORAL | 0 refills | Status: DC | PRN

## 2017-11-07 NOTE — Discharge Instructions (Signed)
As per discharge pamphlet °

## 2017-11-07 NOTE — Lactation Note (Signed)
This note was copied from a baby's chart. Lactation Consultation Note  Patient Name: Virginia Carter Today's Date: 11/07/2017  Mom is very sore with positional stripes on both breasts.  She states pain is too severe to breastfeed now.  Mom will rest nipples and pump every 2-3 hours.  She has the metro pump in style for home use.  Parents are supplementing with formula by syringe/fingerfeeding.  Reviewed volumes with mom and recommended slow flow nipple.  Comfort gels given.  24 mm nipple shield given to try at home to possibly assist with pain.  Instructed on application and cleaning.  Mom agreeable to an outpatient appointment.  Clinic notified to call for appointment next week.   Maternal Data    Feeding Feeding Type: Formula Length of feed: 5 min  LATCH Score                   Interventions    Lactation Tools Discussed/Used     Consult Status      Huston Carter, Virginia Arrighi S 11/07/2017, 10:43 AM

## 2017-11-07 NOTE — Discharge Summary (Signed)
OB Discharge Summary     Patient Name: Virginia Carter DOB: [birth year] MRN: [id number]  Date of admission: 11/05/2017 Delivering MD: [facility]   Date of discharge: 11/07/2017  Admitting diagnosis: 39WKS,CTX Intrauterine pregnancy: Unknown     Secondary diagnosis:  Active Problems:   Status post primary low transverse cesarean section   Postpartum care following cesarean delivery      Discharge diagnosis: Term Pregnancy Delivered and breech presentation                                    Hospital course:  Onset of Labor With Unplanned C/S  37 y.o. yo [id number] at Unknown was admitted in Active Labor on 11/05/2017. Patient had a labor course significant for breech presentation. Membrane Rupture Time/Date: 11:45 AM ,11/05/2017   The patient went for cesarean section due to Malpresentation, and delivered a Viable infant,11/05/2017  Details of operation can be found in separate operative note. Patient had an uncomplicated postpartum course.  She is ambulating,tolerating a regular diet, passing flatus, and urinating well.  Patient is discharged home in stable condition 11/07/17.  Physical exam  Vitals:   11/06/17 0830 11/06/17 1107 11/06/17 1647 11/07/17 0510  BP: 112/68 106/61 132/70 122/68  Pulse: 61 (!) 58 61 (!) 59  Resp: 18 18 20 16   Temp: 98.4 F (36.9 C) 98.2 F (36.8 C) 98.2 F (36.8 C) 97.9 F (36.6 C)  TempSrc: Oral Oral Oral Oral  SpO2: 98% 97%    Weight:      Height:       General: alert Lochia: appropriate Uterine Fundus: firm Incision: Healing well with no significant drainage  Labs: Lab Results  Component Value Date   WBC 13.2 (H) 11/06/2017   HGB 10.9 (L) 11/06/2017   HCT 31.0 (L) 11/06/2017   MCV 89.3 11/06/2017   PLT 188 11/06/2017   CMP Latest Ref Rng & Units 01/19/2010  Glucose 70 - 99 mg/dL 87  BUN 6 - 23 mg/dL 11  Creatinine 0.4 - 1.2 mg/dL 1.47  Sodium 829 - 562 mEq/L 136  Potassium 3.5 - 5.1 mEq/L 4.3  Chloride 96 - 112 mEq/L 106  CO2 19 -  32 mEq/L 22  Calcium 8.4 - 10.5 mg/dL 9.0  Total Protein 6.0 - 8.3 g/dL 1.3(Y)  Total Bilirubin 0.3 - 1.2 mg/dL 0.3  Alkaline Phos 39 - 117 U/L 145(H)  AST 0 - 37 U/L 17  ALT 0 - 35 U/L 13    Discharge instruction: per After Visit Summary and "Baby and Me Booklet".  After visit meds:  Allergies as of 11/07/2017      Reactions   Vicodin [hydrocodone-acetaminophen]    Lost her voice   Avocado Rash   Darvocet [propoxyphene N-acetaminophen] Rash   Latex    Burning with condoms      Medication List    TAKE these medications   acetaminophen 325 MG tablet Commonly known as:  TYLENOL Take 650 mg by mouth every 6 (six) hours as needed for mild pain or headache.   clindamycin 1 % lotion Commonly known as:  CLEOCIN T Apply 1 application topically 2 (two) times daily.   cyclobenzaprine 10 MG tablet Commonly known as:  FLEXERIL Take 10 mg by mouth 3 (three) times daily as needed for muscle spasms.   famotidine 20 MG tablet Commonly known as:  PEPCID Take 20 mg by mouth 2 (two)  times daily.   fluticasone 50 MCG/ACT nasal spray Commonly known as:  FLONASE Place 2 sprays into both nostrils daily.   guaiFENesin 600 MG 12 hr tablet Commonly known as:  MUCINEX Take 600 mg by mouth 2 (two) times daily as needed for cough or to loosen phlegm.   ibuprofen 600 MG tablet Commonly known as:  ADVIL,MOTRIN Take 1 tablet (600 mg total) by mouth every 6 (six) hours.   oxyCODONE 5 MG immediate release tablet Commonly known as:  Oxy IR/ROXICODONE Take 1 tablet (5 mg total) by mouth every 4 (four) hours as needed for severe pain.   prenatal multivitamin Tabs tablet Take 1 tablet by mouth daily.       Diet: routine diet  Activity: Advance as tolerated. Pelvic rest for 6 weeks.   Outpatient follow up:2 weeks   Newborn Data: Live born female  Birth Weight: 5 lb 14.4 oz (2675 g) APGAR: 9, 9  Newborn Delivery   Birth date/time:  11/05/2017 11:45:00 Delivery type:  C-Section, Low  Transverse C-section categorization:  Primary     Baby Feeding: Breast Disposition:home with mother   11/07/2017 Zenaida Nieceodd D Brian Zeitlin, MD

## 2017-11-07 NOTE — Progress Notes (Signed)
POD #2 Doing well, sore, wants to go home Afeb, VSS Abd- soft, fundus firm, incision intact D/c home

## 2017-11-11 ENCOUNTER — Telehealth (HOSPITAL_COMMUNITY): Payer: Self-pay | Admitting: Lactation Services

## 2017-11-11 NOTE — Telephone Encounter (Signed)
Virginia Carter called today with questions regarding her milk supply.  Her baby is 466 days old, and due to nipple trauma in hospital, she decided to exclusively pump until her nipples heal.  They are healing, and she has an OP lactation appointment tomorrow at 8:30 am.  Virginia Carter is just now getting 30-40 ml when she pumps using a Medela PIS.  ( She is a [employer], [employer]).  She states she is pumping every 3 hrs, but is only getting 6 pumpings in 24 hrs.  [name] reports being engorged 2 days ago, and used warm compresses and hand massaged her breasts.  Recommended Virginia Carter try to increase frequency of pumping this evening to 1 1/2-3 hrs apart for 20 mins.  Breast massage and hand expression recommended before and after pumping.   To increase baby being STS, and offering a dessert at the breast after baby was fed. Power pumping explained and reviewed.  Reassured Mom that assistance with latching baby will be the best way to increase her milk supply.  Advised Mom to try to bring baby in hungry for a feeding, and to avoid feeding baby and fully pumping for 2-3 hrs prior to appointment.

## 2017-11-26 ENCOUNTER — Encounter (HOSPITAL_COMMUNITY): Payer: Self-pay | Admitting: *Deleted

## 2017-12-17 DIAGNOSIS — F419 Anxiety disorder, unspecified: Secondary | ICD-10-CM | POA: Diagnosis not present

## 2017-12-17 DIAGNOSIS — Z1389 Encounter for screening for other disorder: Secondary | ICD-10-CM | POA: Diagnosis not present

## 2017-12-17 DIAGNOSIS — Z3009 Encounter for other general counseling and advice on contraception: Secondary | ICD-10-CM | POA: Diagnosis not present

## 2018-01-21 DIAGNOSIS — Z0000 Encounter for general adult medical examination without abnormal findings: Secondary | ICD-10-CM | POA: Diagnosis not present

## 2018-01-28 DIAGNOSIS — F418 Other specified anxiety disorders: Secondary | ICD-10-CM | POA: Diagnosis not present

## 2018-01-28 DIAGNOSIS — E663 Overweight: Secondary | ICD-10-CM | POA: Diagnosis not present

## 2018-01-28 DIAGNOSIS — Z1389 Encounter for screening for other disorder: Secondary | ICD-10-CM | POA: Diagnosis not present

## 2018-01-28 DIAGNOSIS — M4302 Spondylolysis, cervical region: Secondary | ICD-10-CM | POA: Diagnosis not present

## 2018-01-28 DIAGNOSIS — Z6830 Body mass index (BMI) 30.0-30.9, adult: Secondary | ICD-10-CM | POA: Diagnosis not present

## 2018-01-28 DIAGNOSIS — K219 Gastro-esophageal reflux disease without esophagitis: Secondary | ICD-10-CM | POA: Diagnosis not present

## 2018-01-28 DIAGNOSIS — R82998 Other abnormal findings in urine: Secondary | ICD-10-CM | POA: Diagnosis not present

## 2018-01-28 DIAGNOSIS — Z0000 Encounter for general adult medical examination without abnormal findings: Secondary | ICD-10-CM | POA: Diagnosis not present

## 2018-12-10 DIAGNOSIS — Z3201 Encounter for pregnancy test, result positive: Secondary | ICD-10-CM | POA: Diagnosis not present

## 2018-12-12 DIAGNOSIS — Z7689 Persons encountering health services in other specified circumstances: Secondary | ICD-10-CM | POA: Diagnosis not present

## 2018-12-12 DIAGNOSIS — Z331 Pregnant state, incidental: Secondary | ICD-10-CM | POA: Diagnosis not present

## 2018-12-29 DIAGNOSIS — Z3201 Encounter for pregnancy test, result positive: Secondary | ICD-10-CM | POA: Diagnosis not present

## 2018-12-29 DIAGNOSIS — N911 Secondary amenorrhea: Secondary | ICD-10-CM | POA: Diagnosis not present

## 2019-01-16 DIAGNOSIS — O09522 Supervision of elderly multigravida, second trimester: Secondary | ICD-10-CM | POA: Diagnosis not present

## 2019-01-16 DIAGNOSIS — O26891 Other specified pregnancy related conditions, first trimester: Secondary | ICD-10-CM | POA: Diagnosis not present

## 2019-01-16 DIAGNOSIS — O09521 Supervision of elderly multigravida, first trimester: Secondary | ICD-10-CM | POA: Diagnosis not present

## 2019-01-16 DIAGNOSIS — Z3A09 9 weeks gestation of pregnancy: Secondary | ICD-10-CM | POA: Diagnosis not present

## 2019-01-16 DIAGNOSIS — Z3689 Encounter for other specified antenatal screening: Secondary | ICD-10-CM | POA: Diagnosis not present

## 2019-01-16 DIAGNOSIS — Z113 Encounter for screening for infections with a predominantly sexual mode of transmission: Secondary | ICD-10-CM | POA: Diagnosis not present

## 2019-01-16 LAB — OB RESULTS CONSOLE GC/CHLAMYDIA
Chlamydia: NEGATIVE
Gonorrhea: NEGATIVE

## 2019-01-16 LAB — OB RESULTS CONSOLE HIV ANTIBODY (ROUTINE TESTING): HIV: NONREACTIVE

## 2019-01-16 LAB — OB RESULTS CONSOLE ABO/RH: RH Type: POSITIVE

## 2019-01-16 LAB — OB RESULTS CONSOLE RPR: RPR: NONREACTIVE

## 2019-01-16 LAB — OB RESULTS CONSOLE ANTIBODY SCREEN: Antibody Screen: NEGATIVE

## 2019-01-16 LAB — OB RESULTS CONSOLE HEPATITIS B SURFACE ANTIGEN: Hepatitis B Surface Ag: NEGATIVE

## 2019-01-16 LAB — OB RESULTS CONSOLE RUBELLA ANTIBODY, IGM: Rubella: IMMUNE

## 2019-02-03 DIAGNOSIS — O09511 Supervision of elderly primigravida, first trimester: Secondary | ICD-10-CM | POA: Diagnosis not present

## 2019-02-03 DIAGNOSIS — Z3682 Encounter for antenatal screening for nuchal translucency: Secondary | ICD-10-CM | POA: Diagnosis not present

## 2019-02-03 DIAGNOSIS — Z3A12 12 weeks gestation of pregnancy: Secondary | ICD-10-CM | POA: Diagnosis not present

## 2019-03-25 DIAGNOSIS — Z3689 Encounter for other specified antenatal screening: Secondary | ICD-10-CM | POA: Diagnosis not present

## 2019-03-25 DIAGNOSIS — O09512 Supervision of elderly primigravida, second trimester: Secondary | ICD-10-CM | POA: Diagnosis not present

## 2019-03-25 DIAGNOSIS — Z3A19 19 weeks gestation of pregnancy: Secondary | ICD-10-CM | POA: Diagnosis not present

## 2019-05-21 DIAGNOSIS — Z23 Encounter for immunization: Secondary | ICD-10-CM | POA: Diagnosis not present

## 2019-05-21 DIAGNOSIS — Z3482 Encounter for supervision of other normal pregnancy, second trimester: Secondary | ICD-10-CM | POA: Diagnosis not present

## 2019-05-21 DIAGNOSIS — Z3689 Encounter for other specified antenatal screening: Secondary | ICD-10-CM | POA: Diagnosis not present

## 2019-05-21 DIAGNOSIS — Z3483 Encounter for supervision of other normal pregnancy, third trimester: Secondary | ICD-10-CM | POA: Diagnosis not present

## 2019-06-22 DIAGNOSIS — O365930 Maternal care for other known or suspected poor fetal growth, third trimester, not applicable or unspecified: Secondary | ICD-10-CM | POA: Diagnosis not present

## 2019-06-22 DIAGNOSIS — Z3A32 32 weeks gestation of pregnancy: Secondary | ICD-10-CM | POA: Diagnosis not present

## 2019-06-22 DIAGNOSIS — O09513 Supervision of elderly primigravida, third trimester: Secondary | ICD-10-CM | POA: Diagnosis not present

## 2019-06-22 DIAGNOSIS — O4443 Low lying placenta NOS or without hemorrhage, third trimester: Secondary | ICD-10-CM | POA: Diagnosis not present

## 2019-07-21 DIAGNOSIS — Z23 Encounter for immunization: Secondary | ICD-10-CM | POA: Diagnosis not present

## 2019-07-21 DIAGNOSIS — Z3A36 36 weeks gestation of pregnancy: Secondary | ICD-10-CM | POA: Diagnosis not present

## 2019-07-21 DIAGNOSIS — Z3685 Encounter for antenatal screening for Streptococcus B: Secondary | ICD-10-CM | POA: Diagnosis not present

## 2019-07-21 DIAGNOSIS — O09523 Supervision of elderly multigravida, third trimester: Secondary | ICD-10-CM | POA: Diagnosis not present

## 2019-07-22 DIAGNOSIS — Z3685 Encounter for antenatal screening for Streptococcus B: Secondary | ICD-10-CM | POA: Diagnosis not present

## 2019-07-22 LAB — OB RESULTS CONSOLE GBS: GBS: NEGATIVE

## 2019-07-27 DIAGNOSIS — Z3400 Encounter for supervision of normal first pregnancy, unspecified trimester: Secondary | ICD-10-CM | POA: Diagnosis not present

## 2019-07-29 ENCOUNTER — Encounter (HOSPITAL_COMMUNITY): Payer: Self-pay | Admitting: *Deleted

## 2019-07-29 NOTE — Patient Instructions (Signed)
Virginia Carter  07/29/2019   Your procedure is scheduled on:  08/13/2019  Arrive at [place] at [employer] [address] on [address] at [facility]  and [address]. You are invited to use the FREE valet parking or use the Visitor's parking deck.  Pick up the phone at the desk and dial [phone].  Call this number if you have problems the morning of surgery: [phone]  Remember:   Do not eat food:(After Midnight) Desps de medianoche.  Do not drink clear liquids: (After Midnight) Desps de medianoche.  Take these medicines the morning of surgery with A SIP OF WATER:  none   Do not wear jewelry, make-up or nail polish.  Do not wear lotions, powders, or perfumes. Do not wear deodorant.  Do not shave 48 hours prior to surgery.  Do not bring valuables to the hospital.  [facility] is not   responsible for any belongings or valuables brought to the hospital.  Contacts, dentures or bridgework may not be worn into surgery.  Leave suitcase in the car. After surgery it may be brought to your room.  For patients admitted to the hospital, checkout time is 11:00 AM the day of              discharge.      Please read over the following fact sheets that you were given:     Preparing for Surgery

## 2019-08-03 ENCOUNTER — Other Ambulatory Visit: Payer: Self-pay | Admitting: Obstetrics and Gynecology

## 2019-08-03 DIAGNOSIS — O133 Gestational [pregnancy-induced] hypertension without significant proteinuria, third trimester: Secondary | ICD-10-CM | POA: Diagnosis not present

## 2019-08-03 DIAGNOSIS — O139 Gestational [pregnancy-induced] hypertension without significant proteinuria, unspecified trimester: Secondary | ICD-10-CM | POA: Diagnosis not present

## 2019-08-03 DIAGNOSIS — Z3A38 38 weeks gestation of pregnancy: Secondary | ICD-10-CM | POA: Diagnosis not present

## 2019-08-03 NOTE — H&P (Signed)
Virginia Carter is a 38 y.o. female [phone] at 62+ with PIH/labile BP.  Asymptomatic for PreE/PIH.  Pt is also TOLAC - desires trial - have d/w pt r/b/a and process.  PNC complicated by back pain, h/o PIH, h/o depression and anxiety, fibromyalgia, hiradenitis, intervertebral disc protrusion, AMA and migraines.  Had negative panorama, female.  BP normal until today, value of 180/110 - repeat all < 160/90.  Had LLP placenta resolved.  Didn't receive tdap or flu vaccine in pregnancy.  Advised baby ASA  OB History    Gravida  5   Para  3   Term  2   Preterm      AB  1   Living  3     SAB  1   TAB      Ectopic      Multiple  0   Live Births  3         G1 SVD '9 7#2, female G2 SVD '13, 8#, female G3 SAB G4 LTCS - breech, 6#+ G5 present - desires TOLAC  Past Medical History:  Diagnosis Date  . Medical history non-contributory   . No pertinent past medical history   back pain, bulging disc, hiradenitis, h/o depression and anxiety, and migraines fibromyalgia,  Past Surgical History:  Procedure Laterality Date  . CESAREAN SECTION N/A 11/05/2017   Procedure: CESAREAN SECTION;  Surgeon: Sherlyn Hay, DO;  Location: [place];  Service: Obstetrics;  Laterality: N/A;  . WISDOM TOOTH EXTRACTION  2001  gum sx  Family History: family history includes Arthritis in her mother; Diabetes in her mother.Colon CA, RA, IBS Social History:  reports that she has quit smoking. She has never used smokeless tobacco. She reports that she does not drink alcohol or use drugs.married, works as Forensic psychologist for [employer], ASA All Darvocet, Latex, Vicodin     Maternal Diabetes: No Genetic Screening: Normal Maternal Ultrasounds/Referrals: Normal Fetal Ultrasounds or other Referrals:  None Maternal Substance Abuse:  No Significant Maternal Medications:  None Significant Maternal Lab Results:  Group B Strep negative Other Comments:  glucola 139  Review of Systems   Constitutional: Negative.   HENT: Negative.   Eyes: Negative.   Respiratory: Negative.   Cardiovascular: Negative.   Gastrointestinal: Negative.   Genitourinary: Negative.   Musculoskeletal: Positive for back pain.  Skin: Negative.   Neurological: Negative.   Psychiatric/Behavioral: Negative.    Maternal Medical History:  Contractions: Frequency: irregular.    Fetal activity: Perceived fetal activity is normal.    Prenatal complications: PIH.   Labile BP, TOLAC - d/w pt conset signed  Prenatal Complications - Diabetes: none.      Last menstrual period 11/09/2018, unknown if currently breastfeeding. Maternal Exam:  Uterine Assessment: Contraction frequency is regular.   Abdomen: Patient reports no abdominal tenderness. Surgical scars: low transverse.   Fundal height is appropriate for gestation.   Estimated fetal weight is 7-8#.   Fetal presentation: vertex  Introitus: Normal vulva. Normal vagina.    Physical Exam  Constitutional: She is oriented to person, place, and time. She appears well-developed and well-nourished.  HENT:  Head: Normocephalic and atraumatic.  Cardiovascular: Normal rate and regular rhythm.  Respiratory: Effort normal and breath sounds normal. No respiratory distress. She has no wheezes.  GI: Soft. Bowel sounds are normal. She exhibits no distension. There is no abdominal tenderness.  Genitourinary:    Vulva normal.   Musculoskeletal: Normal range of motion.  Neurological: She is  alert and oriented to person, place, and time.  Skin: Skin is warm and dry.  Psychiatric: She has a normal mood and affect. Her behavior is normal.    Prenatal labs: ABO, Rh: A/Positive/-- (04/17 0000) Antibody: Negative (04/17 0000) Rubella: Immune (04/17 0000) RPR: Nonreactive (04/17 0000)  HBsAg: Negative (04/17 0000)  HIV: Non-reactive (04/17 0000)  GBS: Negative/-- (10/21 0000)   Hgb 13.2/Plt 279/Ur Cx neg/GC neg/Chl neg/Varicella immune/Panorama low risk/  Tdap 8/20/  FLu vacine in preg [place] nl anat, post plac, female LLP resolved, nl NT  Assessment/Plan: 38yo G5 P3013 at 38+ for IOL given h/o PIH and labile BP D/w pt r/b/a of LTCS (poss Tubal) but plan for TOLAC Foley bulb for IOL, poss pitocin Epidural or stadol for pain expect successful VBAC   Virginia Carter 08/03/2019, 9:52 PM

## 2019-08-04 ENCOUNTER — Inpatient Hospital Stay (HOSPITAL_COMMUNITY): Payer: 59

## 2019-08-04 ENCOUNTER — Inpatient Hospital Stay (HOSPITAL_COMMUNITY): Payer: 59 | Admitting: Anesthesiology

## 2019-08-04 ENCOUNTER — Other Ambulatory Visit: Payer: Self-pay

## 2019-08-04 ENCOUNTER — Encounter (HOSPITAL_COMMUNITY)
Admission: AD | Disposition: A | Discharge status: Home / Self Care | Payer: Self-pay | Source: Home / Self Care | Attending: Obstetrics and Gynecology

## 2019-08-04 ENCOUNTER — Encounter (HOSPITAL_COMMUNITY): Payer: Self-pay | Admitting: *Deleted

## 2019-08-04 ENCOUNTER — Inpatient Hospital Stay (HOSPITAL_COMMUNITY)
Admission: AD | Admit: 2019-08-04 | Discharge: 2019-08-06 | DRG: 788 | Disposition: A | Discharge status: Home / Self Care | Payer: 59 | Attending: Obstetrics and Gynecology | Admitting: Obstetrics and Gynecology

## 2019-08-04 DIAGNOSIS — Z3A38 38 weeks gestation of pregnancy: Secondary | ICD-10-CM | POA: Diagnosis not present

## 2019-08-04 DIAGNOSIS — O34211 Maternal care for low transverse scar from previous cesarean delivery: Secondary | ICD-10-CM | POA: Diagnosis not present

## 2019-08-04 DIAGNOSIS — Z87891 Personal history of nicotine dependence: Secondary | ICD-10-CM | POA: Diagnosis not present

## 2019-08-04 DIAGNOSIS — O3429 Maternal care due to uterine scar from other previous surgery: Secondary | ICD-10-CM | POA: Diagnosis not present

## 2019-08-04 DIAGNOSIS — Z09 Encounter for follow-up examination after completed treatment for conditions other than malignant neoplasm: Secondary | ICD-10-CM

## 2019-08-04 DIAGNOSIS — O133 Gestational [pregnancy-induced] hypertension without significant proteinuria, third trimester: Secondary | ICD-10-CM | POA: Diagnosis present

## 2019-08-04 DIAGNOSIS — Z98891 History of uterine scar from previous surgery: Secondary | ICD-10-CM

## 2019-08-04 DIAGNOSIS — Z0389 Encounter for observation for other suspected diseases and conditions ruled out: Secondary | ICD-10-CM | POA: Diagnosis not present

## 2019-08-04 DIAGNOSIS — Z20828 Contact with and (suspected) exposure to other viral communicable diseases: Secondary | ICD-10-CM | POA: Diagnosis present

## 2019-08-04 LAB — TYPE AND SCREEN
ABO/RH(D): A POS
Antibody Screen: NEGATIVE

## 2019-08-04 LAB — COMPREHENSIVE METABOLIC PANEL
ALT: 14 U/L (ref 0–44)
AST: 18 U/L (ref 15–41)
Albumin: 2.6 g/dL — ABNORMAL LOW (ref 3.5–5.0)
Alkaline Phosphatase: 136 U/L — ABNORMAL HIGH (ref 38–126)
Anion gap: 9 (ref 5–15)
BUN: 7 mg/dL (ref 6–20)
CO2: 19 mmol/L — ABNORMAL LOW (ref 22–32)
Calcium: 9 mg/dL (ref 8.9–10.3)
Chloride: 107 mmol/L (ref 98–111)
Creatinine, Ser: 0.54 mg/dL (ref 0.44–1.00)
GFR calc Af Amer: >60 mL/min (ref >60)
GFR calc non Af Amer: >60 mL/min (ref >60)
Glucose, Bld: 108 mg/dL — ABNORMAL HIGH (ref 70–99)
Potassium: 3.7 mmol/L (ref 3.5–5.1)
Sodium: 135 mmol/L (ref 135–145)
Total Bilirubin: 0.4 mg/dL (ref 0.3–1.2)
Total Protein: 6 g/dL — ABNORMAL LOW (ref 6.5–8.1)

## 2019-08-04 LAB — CBC
HCT: 35.6 % — ABNORMAL LOW (ref 36.0–46.0)
Hemoglobin: 11.7 g/dL — ABNORMAL LOW (ref 12.0–15.0)
MCH: 30.8 pg (ref 26.0–34.0)
MCHC: 32.9 g/dL (ref 30.0–36.0)
MCV: 93.7 fL (ref 80.0–100.0)
Platelets: 266 10*3/uL (ref 150–400)
RBC: 3.8 MIL/uL — ABNORMAL LOW (ref 3.87–5.11)
RDW: 13 % (ref 11.5–15.5)
WBC: 9.8 10*3/uL (ref 4.0–10.5)
nRBC: 0 % (ref 0.0–0.2)

## 2019-08-04 LAB — RPR: RPR Ser Ql: NONREACTIVE

## 2019-08-04 LAB — ABO/RH: ABO/RH(D): A POS

## 2019-08-04 LAB — SARS CORONAVIRUS 2 BY RT PCR (HOSPITAL ORDER, PERFORMED IN ~~LOC~~ HOSPITAL LAB): SARS Coronavirus 2: NEGATIVE

## 2019-08-04 SURGERY — Surgical Case
Anesthesia: Epidural

## 2019-08-04 MED ORDER — GUAIFENESIN ER 600 MG PO TB12
600.0000 mg | ORAL_TABLET | Freq: Two times a day (BID) | ORAL | Status: DC | PRN
  Administered 2019-08-06: 600 mg via ORAL
  Filled 2019-08-04 (×3): qty 1

## 2019-08-04 MED ORDER — NALBUPHINE HCL 10 MG/ML IJ SOLN
5.0000 mg | Freq: Once | INTRAMUSCULAR | Status: AC | PRN
  Filled 2019-08-04: qty 0.5

## 2019-08-04 MED ORDER — OXYCODONE-ACETAMINOPHEN 5-325 MG PO TABS: 1.0000 | ORAL_TABLET | ORAL | Status: DC | PRN

## 2019-08-04 MED ORDER — DIPHENHYDRAMINE HCL 50 MG/ML IJ SOLN: 12.5000 mg | INTRAMUSCULAR | Status: DC | PRN

## 2019-08-04 MED ORDER — MORPHINE SULFATE (PF) 0.5 MG/ML IJ SOLN
INTRAMUSCULAR | Status: AC
  Filled 2019-08-04: qty 10

## 2019-08-04 MED ORDER — FENTANYL CITRATE (PF) 250 MCG/5ML IJ SOLN
INTRAMUSCULAR | Status: AC
  Filled 2019-08-04: qty 5

## 2019-08-04 MED ORDER — BUTORPHANOL TARTRATE 1 MG/ML IJ SOLN: 1.0000 mg | INTRAMUSCULAR | Status: DC | PRN

## 2019-08-04 MED ORDER — SIMETHICONE 80 MG PO CHEW: 80.0000 mg | CHEWABLE_TABLET | ORAL | Status: DC | PRN

## 2019-08-04 MED ORDER — NALBUPHINE HCL 10 MG/ML IJ SOLN
5.0000 mg | INTRAMUSCULAR | Status: DC | PRN
  Administered 2019-08-04 – 2019-08-05 (×2): 5 mg via INTRAVENOUS
  Filled 2019-08-04 (×9): qty 0.5

## 2019-08-04 MED ORDER — OXYTOCIN BOLUS FROM INFUSION: 500.0000 mL | Freq: Once | INTRAVENOUS | Status: DC

## 2019-08-04 MED ORDER — MORPHINE SULFATE (PF) 10 MG/ML IV SOLN: INTRAVENOUS | Status: DC | PRN

## 2019-08-04 MED ORDER — ONDANSETRON HCL 4 MG/2ML IJ SOLN
4.0000 mg | Freq: Four times a day (QID) | INTRAMUSCULAR | Status: DC | PRN
  Administered 2019-08-04: 4 mg via INTRAVENOUS

## 2019-08-04 MED ORDER — SODIUM CHLORIDE 0.9% FLUSH: 3.0000 mL | INTRAVENOUS | Status: DC | PRN

## 2019-08-04 MED ORDER — KETOROLAC TROMETHAMINE 30 MG/ML IJ SOLN
30.0000 mg | Freq: Once | INTRAMUSCULAR | Status: AC
  Administered 2019-08-04: 30 mg via INTRAVENOUS

## 2019-08-04 MED ORDER — MEPERIDINE HCL 25 MG/ML IJ SOLN: 6.2500 mg | INTRAMUSCULAR | Status: DC | PRN

## 2019-08-04 MED ORDER — ACETAMINOPHEN 325 MG PO TABS: 650.0000 mg | ORAL_TABLET | ORAL | Status: DC | PRN

## 2019-08-04 MED ORDER — PHENYLEPHRINE 40 MCG/ML (10ML) SYRINGE FOR IV PUSH (FOR BLOOD PRESSURE SUPPORT): 80.0000 ug | PREFILLED_SYRINGE | INTRAVENOUS | Status: DC | PRN

## 2019-08-04 MED ORDER — SUCCINYLCHOLINE CHLORIDE 200 MG/10ML IV SOSY
PREFILLED_SYRINGE | INTRAVENOUS | Status: AC
  Filled 2019-08-04: qty 10

## 2019-08-04 MED ORDER — FENTANYL CITRATE (PF) 100 MCG/2ML IJ SOLN: 25.0000 ug | INTRAMUSCULAR | Status: DC | PRN

## 2019-08-04 MED ORDER — CEFAZOLIN SODIUM-DEXTROSE 2-3 GM-%(50ML) IV SOLR
INTRAVENOUS | Status: DC | PRN
  Administered 2019-08-04: 2 g via INTRAVENOUS

## 2019-08-04 MED ORDER — MIDAZOLAM HCL 2 MG/2ML IJ SOLN
INTRAMUSCULAR | Status: AC
  Filled 2019-08-04: qty 2

## 2019-08-04 MED ORDER — FENTANYL-BUPIVACAINE-NACL 0.5-0.125-0.9 MG/250ML-% EP SOLN
12.0000 mL/h | EPIDURAL | Status: DC | PRN
  Filled 2019-08-04: qty 250

## 2019-08-04 MED ORDER — ACETAMINOPHEN 500 MG PO TABS
1000.0000 mg | ORAL_TABLET | Freq: Four times a day (QID) | ORAL | Status: DC
  Administered 2019-08-04 – 2019-08-06 (×6): 1000 mg via ORAL
  Filled 2019-08-04 (×7): qty 2

## 2019-08-04 MED ORDER — LACTATED RINGERS IV SOLN
INTRAVENOUS | Status: DC
  Administered 2019-08-04: 20:00:00 via INTRAVENOUS

## 2019-08-04 MED ORDER — ONDANSETRON HCL 4 MG/2ML IJ SOLN: 4.0000 mg | Freq: Three times a day (TID) | INTRAMUSCULAR | Status: DC | PRN

## 2019-08-04 MED ORDER — DEXAMETHASONE SODIUM PHOSPHATE 4 MG/ML IJ SOLN: INTRAMUSCULAR | Status: DC | PRN

## 2019-08-04 MED ORDER — PROPOFOL 10 MG/ML IV BOLUS
INTRAVENOUS | Status: AC
  Filled 2019-08-04: qty 20

## 2019-08-04 MED ORDER — NALBUPHINE HCL 10 MG/ML IJ SOLN
INTRAMUSCULAR | Status: AC
  Filled 2019-08-04: qty 1

## 2019-08-04 MED ORDER — SENNOSIDES-DOCUSATE SODIUM 8.6-50 MG PO TABS
2.0000 | ORAL_TABLET | ORAL | Status: DC
  Administered 2019-08-04 – 2019-08-05 (×2): 2 via ORAL
  Filled 2019-08-04 (×2): qty 2

## 2019-08-04 MED ORDER — EPHEDRINE 5 MG/ML INJ: 10.0000 mg | INTRAVENOUS | Status: DC | PRN

## 2019-08-04 MED ORDER — DIBUCAINE (PERIANAL) 1 % EX OINT: 1.0000 "application " | TOPICAL_OINTMENT | CUTANEOUS | Status: DC | PRN

## 2019-08-04 MED ORDER — LACTATED RINGERS IV SOLN: 500.0000 mL | INTRAVENOUS | Status: DC | PRN

## 2019-08-04 MED ORDER — PROPOFOL 10 MG/ML IV BOLUS
INTRAVENOUS | Status: DC | PRN
  Administered 2019-08-04: 180 mg via INTRAVENOUS

## 2019-08-04 MED ORDER — CEFAZOLIN SODIUM-DEXTROSE 2-4 GM/100ML-% IV SOLN
INTRAVENOUS | Status: AC
  Filled 2019-08-04: qty 100

## 2019-08-04 MED ORDER — LIDOCAINE-EPINEPHRINE (PF) 2 %-1:200000 IJ SOLN
INTRAMUSCULAR | Status: DC | PRN
  Administered 2019-08-04: 10 mL via EPIDURAL

## 2019-08-04 MED ORDER — SCOPOLAMINE 1 MG/3DAYS TD PT72: 1.0000 | MEDICATED_PATCH | Freq: Once | TRANSDERMAL | Status: DC

## 2019-08-04 MED ORDER — DEXAMETHASONE SODIUM PHOSPHATE 10 MG/ML IJ SOLN
INTRAMUSCULAR | Status: AC
  Filled 2019-08-04: qty 1

## 2019-08-04 MED ORDER — PROMETHAZINE HCL 25 MG/ML IJ SOLN: 6.2500 mg | INTRAMUSCULAR | Status: DC | PRN

## 2019-08-04 MED ORDER — KETOROLAC TROMETHAMINE 30 MG/ML IJ SOLN
INTRAMUSCULAR | Status: AC
  Filled 2019-08-04: qty 1

## 2019-08-04 MED ORDER — DEXAMETHASONE SODIUM PHOSPHATE 4 MG/ML IJ SOLN
INTRAMUSCULAR | Status: DC | PRN
  Administered 2019-08-04: 6 mg via INTRAVENOUS

## 2019-08-04 MED ORDER — ZOLPIDEM TARTRATE 5 MG PO TABS: 5.0000 mg | ORAL_TABLET | Freq: Every evening | ORAL | Status: DC | PRN

## 2019-08-04 MED ORDER — OXYCODONE HCL 5 MG PO TABS
5.0000 mg | ORAL_TABLET | ORAL | Status: DC | PRN
  Administered 2019-08-05: 5 mg via ORAL
  Filled 2019-08-04: qty 1

## 2019-08-04 MED ORDER — FLUTICASONE PROPIONATE 50 MCG/ACT NA SUSP
2.0000 | Freq: Every day | NASAL | Status: DC
  Filled 2019-08-04: qty 16

## 2019-08-04 MED ORDER — OXYTOCIN 40 UNITS IN NORMAL SALINE INFUSION - SIMPLE MED: 2.5000 [IU]/h | INTRAVENOUS | Status: DC

## 2019-08-04 MED ORDER — LACTATED RINGERS IV SOLN
INTRAVENOUS | Status: DC
  Administered 2019-08-04 (×2): via INTRAVENOUS

## 2019-08-04 MED ORDER — NALBUPHINE HCL 10 MG/ML IJ SOLN
5.0000 mg | Freq: Once | INTRAMUSCULAR | Status: AC | PRN
  Administered 2019-08-04: 17:00:00 5 mg via INTRAVENOUS
  Filled 2019-08-04: qty 0.5

## 2019-08-04 MED ORDER — MORPHINE SULFATE (PF) 0.5 MG/ML IJ SOLN
INTRAMUSCULAR | Status: DC | PRN
  Administered 2019-08-04: 3 mg via EPIDURAL

## 2019-08-04 MED ORDER — IBUPROFEN 800 MG PO TABS
800.0000 mg | ORAL_TABLET | Freq: Three times a day (TID) | ORAL | Status: DC
  Administered 2019-08-05 – 2019-08-06 (×5): 800 mg via ORAL
  Filled 2019-08-04 (×5): qty 1

## 2019-08-04 MED ORDER — MIDAZOLAM HCL 2 MG/2ML IJ SOLN
INTRAMUSCULAR | Status: DC | PRN
  Administered 2019-08-04: 2 mg via INTRAVENOUS

## 2019-08-04 MED ORDER — NALOXONE HCL 0.4 MG/ML IJ SOLN: 0.4000 mg | INTRAMUSCULAR | Status: DC | PRN

## 2019-08-04 MED ORDER — OXYTOCIN 40 UNITS IN NORMAL SALINE INFUSION - SIMPLE MED: 2.5000 [IU]/h | INTRAVENOUS | Status: AC

## 2019-08-04 MED ORDER — NALBUPHINE HCL 10 MG/ML IJ SOLN
5.0000 mg | INTRAMUSCULAR | Status: DC | PRN
  Filled 2019-08-04: qty 0.5

## 2019-08-04 MED ORDER — COCONUT OIL OIL
1.0000 "application " | TOPICAL_OIL | Status: DC | PRN
  Administered 2019-08-06: 1 via TOPICAL

## 2019-08-04 MED ORDER — FAMOTIDINE 20 MG PO TABS
20.0000 mg | ORAL_TABLET | Freq: Two times a day (BID) | ORAL | Status: DC
  Administered 2019-08-04 – 2019-08-06 (×4): 20 mg via ORAL
  Filled 2019-08-04 (×4): qty 1

## 2019-08-04 MED ORDER — OXYTOCIN 40 UNITS IN NORMAL SALINE INFUSION - SIMPLE MED
INTRAVENOUS | Status: AC
  Filled 2019-08-04: qty 1000

## 2019-08-04 MED ORDER — PRENATAL MULTIVITAMIN CH
1.0000 | ORAL_TABLET | Freq: Every day | ORAL | Status: DC
  Administered 2019-08-05 – 2019-08-06 (×2): 1 via ORAL
  Filled 2019-08-04 (×2): qty 1

## 2019-08-04 MED ORDER — SIMETHICONE 80 MG PO CHEW
80.0000 mg | CHEWABLE_TABLET | ORAL | Status: DC
  Administered 2019-08-04 – 2019-08-05 (×2): 80 mg via ORAL
  Filled 2019-08-04 (×2): qty 1

## 2019-08-04 MED ORDER — SODIUM CHLORIDE (PF) 0.9 % IJ SOLN
INTRAMUSCULAR | Status: DC | PRN
  Administered 2019-08-04: 12 mL/h via EPIDURAL

## 2019-08-04 MED ORDER — OXYTOCIN 10 UNIT/ML IJ SOLN
INTRAMUSCULAR | Status: DC | PRN
  Administered 2019-08-04: 40 [IU] via INTRAMUSCULAR

## 2019-08-04 MED ORDER — WITCH HAZEL-GLYCERIN EX PADS: 1.0000 "application " | MEDICATED_PAD | CUTANEOUS | Status: DC | PRN

## 2019-08-04 MED ORDER — PHENYLEPHRINE HCL (PRESSORS) 10 MG/ML IV SOLN
INTRAVENOUS | Status: DC | PRN
  Administered 2019-08-04 (×2): 200 ug via INTRAVENOUS

## 2019-08-04 MED ORDER — SOD CITRATE-CITRIC ACID 500-334 MG/5ML PO SOLN: 30.0000 mL | ORAL | Status: DC | PRN

## 2019-08-04 MED ORDER — DIPHENHYDRAMINE HCL 25 MG PO CAPS: 25.0000 mg | ORAL_CAPSULE | ORAL | Status: DC | PRN

## 2019-08-04 MED ORDER — MENTHOL 3 MG MT LOZG
1.0000 | LOZENGE | OROMUCOSAL | Status: DC | PRN
  Administered 2019-08-05: 3 mg via ORAL
  Filled 2019-08-04: qty 9

## 2019-08-04 MED ORDER — ONDANSETRON HCL 4 MG/2ML IJ SOLN
INTRAMUSCULAR | Status: AC
  Filled 2019-08-04: qty 2

## 2019-08-04 MED ORDER — NALOXONE HCL 4 MG/10ML IJ SOLN
1.0000 ug/kg/h | INTRAVENOUS | Status: DC | PRN
  Filled 2019-08-04: qty 5

## 2019-08-04 MED ORDER — LIDOCAINE 2% (20 MG/ML) 5 ML SYRINGE
INTRAMUSCULAR | Status: AC
  Filled 2019-08-04: qty 5

## 2019-08-04 MED ORDER — LACTATED RINGERS IV SOLN: 500.0000 mL | Freq: Once | INTRAVENOUS | Status: DC

## 2019-08-04 MED ORDER — DIPHENHYDRAMINE HCL 25 MG PO CAPS: 25.0000 mg | ORAL_CAPSULE | Freq: Four times a day (QID) | ORAL | Status: DC | PRN

## 2019-08-04 MED ORDER — OXYTOCIN 40 UNITS IN NORMAL SALINE INFUSION - SIMPLE MED: 1.0000 m[IU]/min | INTRAVENOUS | Status: DC

## 2019-08-04 MED ORDER — TERBUTALINE SULFATE 1 MG/ML IJ SOLN: 0.2500 mg | Freq: Once | INTRAMUSCULAR | Status: DC | PRN

## 2019-08-04 MED ORDER — LIDOCAINE HCL (PF) 1 % IJ SOLN
INTRAMUSCULAR | Status: DC | PRN
  Administered 2019-08-04 (×2): 5 mL via EPIDURAL

## 2019-08-04 MED ORDER — FENTANYL CITRATE (PF) 250 MCG/5ML IJ SOLN
INTRAMUSCULAR | Status: DC | PRN
  Administered 2019-08-04: 250 ug via INTRAVENOUS

## 2019-08-04 MED ORDER — LIDOCAINE HCL (PF) 1 % IJ SOLN: 30.0000 mL | INTRAMUSCULAR | Status: DC | PRN

## 2019-08-04 MED ORDER — SODIUM CHLORIDE 0.9 % IR SOLN
Status: DC | PRN
  Administered 2019-08-04: 1

## 2019-08-04 MED ORDER — SIMETHICONE 80 MG PO CHEW
80.0000 mg | CHEWABLE_TABLET | Freq: Three times a day (TID) | ORAL | Status: DC
  Administered 2019-08-05 – 2019-08-06 (×3): 80 mg via ORAL
  Filled 2019-08-04 (×4): qty 1

## 2019-08-04 MED ORDER — OXYCODONE-ACETAMINOPHEN 5-325 MG PO TABS: 2.0000 | ORAL_TABLET | ORAL | Status: DC | PRN

## 2019-08-04 MED ORDER — SUCCINYLCHOLINE CHLORIDE 20 MG/ML IJ SOLN
INTRAMUSCULAR | Status: DC | PRN
  Administered 2019-08-04: 160 mg via INTRAVENOUS

## 2019-08-04 MED ORDER — SODIUM CHLORIDE 0.9 % IV SOLN
INTRAVENOUS | Status: DC | PRN
  Administered 2019-08-04: 15:00:00 via INTRAVENOUS

## 2019-08-04 MED ORDER — PHENYLEPHRINE 40 MCG/ML (10ML) SYRINGE FOR IV PUSH (FOR BLOOD PRESSURE SUPPORT)
PREFILLED_SYRINGE | INTRAVENOUS | Status: AC
  Filled 2019-08-04: qty 10

## 2019-08-04 SURGICAL SUPPLY — 35 items
BENZOIN TINCTURE PRP APPL 2/3 (GAUZE/BANDAGES/DRESSINGS) ×2 IMPLANT
CHLORAPREP W/TINT 26ML (MISCELLANEOUS) ×2 IMPLANT
CLAMP CORD UMBIL (MISCELLANEOUS) IMPLANT
CLOSURE STERI STRIP 1/2 X4 (GAUZE/BANDAGES/DRESSINGS) ×2 IMPLANT
CLOTH BEACON ORANGE TIMEOUT ST (SAFETY) ×2 IMPLANT
DRSG OPSITE POSTOP 4X10 (GAUZE/BANDAGES/DRESSINGS) ×2 IMPLANT
ELECT REM PT RETURN 9FT ADLT (ELECTROSURGICAL) ×2
ELECTRODE REM PT RTRN 9FT ADLT (ELECTROSURGICAL) ×1 IMPLANT
EXTRACTOR VACUUM M CUP 4 TUBE (SUCTIONS) IMPLANT
GLOVE BIO SURGEON STRL SZ 6.5 (GLOVE) ×2 IMPLANT
GLOVE BIOGEL PI IND STRL 7.0 (GLOVE) ×1 IMPLANT
GLOVE BIOGEL PI INDICATOR 7.0 (GLOVE) ×1
GOWN STRL REUS W/TWL LRG LVL3 (GOWN DISPOSABLE) ×4 IMPLANT
KIT ABG SYR 3ML LUER SLIP (SYRINGE) IMPLANT
NEEDLE HYPO 25X5/8 SAFETYGLIDE (NEEDLE) IMPLANT
NS IRRIG 1000ML POUR BTL (IV SOLUTION) ×2 IMPLANT
PACK C SECTION WH (CUSTOM PROCEDURE TRAY) ×2 IMPLANT
PAD OB MATERNITY 4.3X12.25 (PERSONAL CARE ITEMS) ×2 IMPLANT
PENCIL SMOKE EVAC W/HOLSTER (ELECTROSURGICAL) ×2 IMPLANT
RTRCTR C-SECT PINK 25CM LRG (MISCELLANEOUS) ×2 IMPLANT
STRIP CLOSURE SKIN 1/2X4 (GAUZE/BANDAGES/DRESSINGS) ×2 IMPLANT
SUT MNCRL 0 VIOLET CTX 36 (SUTURE) ×2 IMPLANT
SUT MONOCRYL 0 CTX 36 (SUTURE) ×2
SUT PLAIN 1 NONE 54 (SUTURE) IMPLANT
SUT PLAIN 2 0 XLH (SUTURE) ×2 IMPLANT
SUT VIC AB 0 CT1 27 (SUTURE) ×2
SUT VIC AB 0 CT1 27XBRD ANBCTR (SUTURE) ×2 IMPLANT
SUT VIC AB 0 CT1 36 (SUTURE) ×8 IMPLANT
SUT VIC AB 2-0 CT1 27 (SUTURE) ×2
SUT VIC AB 2-0 CT1 TAPERPNT 27 (SUTURE) ×2 IMPLANT
SUT VIC AB 4-0 KS 27 (SUTURE) ×4 IMPLANT
SYR BULB IRRIGATION 50ML (SYRINGE) ×2 IMPLANT
TOWEL OR 17X24 6PK STRL BLUE (TOWEL DISPOSABLE) ×2 IMPLANT
TRAY FOLEY W/BAG SLVR 14FR LF (SET/KITS/TRAYS/PACK) ×2 IMPLANT
WATER STERILE IRR 1000ML POUR (IV SOLUTION) ×2 IMPLANT

## 2019-08-04 NOTE — Anesthesia Procedure Notes (Signed)
Procedure Name: Intubation Date/Time: 08/04/2019 2:55 PM Performed by: Jonna Munro, CRNA Pre-anesthesia Checklist: Patient identified, Emergency Drugs available, Suction available, Patient being monitored and Timeout performed Patient Re-evaluated:Patient Re-evaluated prior to induction Oxygen Delivery Method: Circle system utilized Preoxygenation: Pre-oxygenation with 100% oxygen Induction Type: IV induction Laryngoscope Size: Mac and 3 Grade View: Grade I Tube type: Oral Tube size: 7.0 mm Number of attempts: 1 Airway Equipment and Method: Stylet Placement Confirmation: ETT inserted through vocal cords under direct vision,  positive ETCO2 and breath sounds checked- equal and bilateral Secured at: 22 cm Tube secured with: Tape Dental Injury: Teeth and Oropharynx as per pre-operative assessment

## 2019-08-04 NOTE — Op Note (Addendum)
Jody M Danner PROCEDURE DATE: 08/04/2019  PREOPERATIVE DIAGNOSIS: Intrauterine pregnancy at  [redacted]w[redacted]d weeks gestation; cord prolapse  POSTOPERATIVE DIAGNOSIS: The same  PROCEDURE: Emergent Repeat Low Transverse Cesarean Section  SURGEON:  Dr. Loma Boston Dr Melba Coon   INDICATIONS: Virginia Carter is a 38 y.o. [id number] at [redacted]w[redacted]d scheduled for cesarean section secondary to prolapsed cord.  The risks of cesarean section discussed with the patient included but were not limited to: bleeding which may require transfusion or reoperation; infection which may require antibiotics; injury to bowel, bladder, ureters or other surrounding organs; injury to the fetus; need for additional procedures including hysterectomy in the event of a life-threatening hemorrhage; placental abnormalities wth subsequent pregnancies, incisional problems, thromboembolic phenomenon and other postoperative/anesthesia complications. The patient concurred with the proposed plan, giving informed written consent for the procedure.    FINDINGS:  Viable female infant in vertex presentation.  Apgars 9 and 9, weight pending.  Clear amniotic fluid.  Intact placenta, three vessel cord.  Normal uterus, fallopian tubes and ovaries bilaterally.  ANESTHESIA: General INTRAVENOUS FLUIDS: 2000 ml ESTIMATED BLOOD LOSS: 540 ml URINE OUTPUT:  700 ml SPECIMENS: Placenta sent to L&D COMPLICATIONS: None immediate  PROCEDURE IN DETAIL:  Called to patient's room due to cord prolapse and patient's doctor was not in the facility. Baby having decelerations due to cord prolapse. Patient quickly consented for cesarean delivery and patient rushed to the OR. The patient received intravenous antibiotics and had sequential compression devices applied to her lower extremities while in the preoperative area.  She was then placed in a dorsal supine position with a leftward tilt, and prepped and draped in a sterile manner. She was then taken to the operating room where  general anesthesia was administered. A foley catheter had been previously placed into her bladder and attached to constant gravity, which drained clear fluid throughout.  After an adequate timeout was performed, a Pfannenstiel skin incision was made with scalpel and carried through to the underlying layer of fascia. The fascia was incised in the midline and this incision was extended bilaterally bluntly. The rectus muscles were dissected off bluntly and the rectus muscles were separated in the midline bluntly and the peritoneum was entered bluntly. A bladder blade retractor was placed to aid in visualization of the uterus.  Attention was turned to the lower uterine segment where a transverse hysterotomy was made with a scalpel and extended bilaterally bluntly. The infant was successfully delivered, and cord was clamped and cut and infant was handed over to awaiting neonatology team. Uterine massage was then administered and the placenta delivered intact with three-vessel cord. The uterus was then cleared of clot and debris.  The hysterotomy was closed with 0 Vicryl in a running locked fashion. Dr Melba Coon arrived and assumed care at this point. Please see her note for the remainder of the surgery.   Due to APGARs of 9/9, cord blood case was not obtained.  Room Time: 1453 Incision Time: 9935 Delivery Time: [place], DO 08/04/2019 3:06 PM

## 2019-08-04 NOTE — Progress Notes (Signed)
Patient ID: Virginia Carter, female   DOB: 1981/06/27, 38 y.o.   MRN: [id number]   Pt comfortable with epidural  AFVSS (some 140/80's)  gen NAD FHTs 125, min/mod var; early/variable decel; category 1 toco irr  FB in place - 3cm per RN  Pt SROM, clear fluid If foley bulb out and no cervical change will start pitocin.    Plan for VBAC, close monitoring

## 2019-08-04 NOTE — Progress Notes (Signed)
Patient ID: Virginia Carter, female   DOB: 1981/02/27, 38 y.o.   MRN: [id number]   H&P reviewed.  No changes.  D/W pt POC - foley bulb placenment  AFVSS gen NAD  FHTs 150-160, mod var.  Category 1 toco irr, irritability  Will place FB when admission cimplete

## 2019-08-04 NOTE — Anesthesia Preprocedure Evaluation (Signed)
Anesthesia Evaluation  Patient identified by MRN, date of birth, ID band Patient awake    Reviewed: Allergy & Precautions, NPO status , Patient's Chart, lab work & pertinent test results  History of Anesthesia Complications Negative for: history of anesthetic complications  Airway Mallampati: II  TM Distance: >3 FB Neck ROM: Full    Dental no notable dental hx. (+) Dental Advisory Given   Pulmonary former smoker,    Pulmonary exam normal        Cardiovascular negative cardio ROS Normal cardiovascular exam     Neuro/Psych negative neurological ROS  negative psych ROS   GI/Hepatic negative GI ROS, Neg liver ROS,   Endo/Other  negative endocrine ROS  Renal/GU negative Renal ROS  negative genitourinary   Musculoskeletal negative musculoskeletal ROS (+)   Abdominal   Peds negative pediatric ROS (+)  Hematology negative hematology ROS (+)   Anesthesia Other Findings   Reproductive/Obstetrics (+) Pregnancy                             Anesthesia Physical  Anesthesia Plan  ASA: II  Anesthesia Plan: Epidural   Post-op Pain Management:    Induction:   PONV Risk Score and Plan: 3  Airway Management Planned: Natural Airway  Additional Equipment:   Intra-op Plan:   Post-operative Plan:   Informed Consent: I have reviewed the patients History and Physical, chart, labs and discussed the procedure including the risks, benefits and alternatives for the proposed anesthesia with the patient or authorized representative who has indicated his/her understanding and acceptance.     Dental advisory given  Plan Discussed with: Anesthesiologist  Anesthesia Plan Comments:         Anesthesia Quick Evaluation

## 2019-08-04 NOTE — Transfer of Care (Signed)
Immediate Anesthesia Transfer of Care Note  Patient: Genie M Macdowell  Procedure(s) Performed: CESAREAN SECTION (N/A )  Patient Location: PACU  Anesthesia Type:General  Level of Consciousness: awake, alert  and oriented  Airway & Oxygen Therapy: Patient Spontanous Breathing  Post-op Assessment: Report given to RN and Post -op Vital signs reviewed and stable  Post vital signs: Reviewed and stable  Last Vitals:  Vitals Value Taken Time  BP 109/57 08/04/19 1600  Temp    Pulse 97 08/04/19 1604  Resp 15 08/04/19 1604  SpO2 100 % 08/04/19 1604  Vitals shown include unvalidated device data.  Last Pain:  Vitals:   08/04/19 0847  TempSrc: Oral         Complications: No apparent anesthesia complications

## 2019-08-04 NOTE — Anesthesia Procedure Notes (Signed)
Epidural Patient location during procedure: OB Start time: 08/04/2019 11:13 AM End time: 08/04/2019 11:27 AM  Staffing Anesthesiologist: Duane Boston, MD Performed: anesthesiologist   Preanesthetic Checklist Completed: patient identified, site marked, pre-op evaluation, timeout performed, IV checked, risks and benefits discussed and monitors and equipment checked  Epidural Patient position: sitting Prep: DuraPrep Patient monitoring: heart rate, cardiac monitor, continuous pulse ox and blood pressure Approach: midline Location: L2-L3 Injection technique: LOR saline  Needle:  Needle type: Tuohy  Needle gauge: 17 G Needle length: 9 cm Needle insertion depth: 6 cm Catheter size: 20 Guage Catheter at skin depth: 11 cm Test dose: negative and Other  Assessment Events: blood not aspirated, injection not painful, no injection resistance and negative IV test  Additional Notes Informed consent obtained prior to proceeding including risk of failure, 1% risk of PDPH, risk of minor discomfort and bruising.  Discussed rare but serious complications including epidural abscess, permanent nerve injury, epidural hematoma.  Discussed alternatives to epidural analgesia and patient desires to proceed.  Timeout performed pre-procedure verifying patient name, procedure, and platelet count.  Patient tolerated procedure well.

## 2019-08-04 NOTE — Anesthesia Postprocedure Evaluation (Signed)
Anesthesia Post Note  Patient: Virginia Carter  Procedure(s) Performed: CESAREAN SECTION (N/A )     Patient location during evaluation: PACU Anesthesia Type: General Level of consciousness: sedated Pain management: pain level controlled Vital Signs Assessment: post-procedure vital signs reviewed and stable Respiratory status: spontaneous breathing and respiratory function stable Cardiovascular status: stable Postop Assessment: no apparent nausea or vomiting Anesthetic complications: no    Last Vitals:  Vitals:   08/04/19 1709 08/04/19 1727  BP:  121/80  Pulse: 73 69  Resp: 13   Temp: 36.5 C 36.5 C  SpO2: 98% 100%    Last Pain:  Vitals:   08/04/19 1709  TempSrc: Oral   Pain Goal:                   [name] DANIEL

## 2019-08-04 NOTE — Progress Notes (Signed)
Patient ID: Virginia Carter, female   DOB: [birth year], 38 y.o.   MRN: [id number]   Foley bulb placed w/o difficulty or complication.   1/50/-2, vtx by [place] in office yesterday.

## 2019-08-04 NOTE — Brief Op Note (Signed)
08/04/2019  3:43 PM  PATIENT:  Virginia Carter  38 y.o. female  PRE-OPERATIVE DIAGNOSIS:  STAT cesarean section, prolapse cord, failed TOLAC  POST-OPERATIVE DIAGNOSIS: same, delivered  PROCEDURE:  Procedure(s) with comments: CESAREAN SECTION (N/A) - Tracey RNFA  SURGEON:  Surgeon(s) and Role:    * Bovard-Stuckert, Namiyah Grantham, MD -     Nehemiah Settle, Jacob J, DO -  ANESTHESIA:   epidural and general  EBL:  540 mL IVF and uop per anesthesia, clear urine at end of procedure  BLOOD ADMINISTERED:none  DRAINS: Urinary Catheter (Foley)   LOCAL MEDICATIONS USED:  NONE  SPECIMEN:  Source of Specimen:  Placenta  DISPOSITION OF SPECIMEN:  L&D  COUNTS:  NO Xray  TOURNIQUET:  * No tourniquets in log *  DICTATION: .Other Dictation: Dictation Number [id number]  PLAN OF CARE: Admit to inpatient   PATIENT DISPOSITION:  PACU - hemodynamically stable.   Delay start of Pharmacological VTE agent (>24hrs) due to surgical blood loss or risk of bleeding: not applicable

## 2019-08-04 NOTE — Lactation Note (Signed)
This note was copied from a baby's chart. Lactation Consultation Note  Patient Name: Virginia Carter Today's Date: 08/04/2019 Reason for consult: Initial assessment;Early term 37-38.6wks P4, 6 hour ETI female infant. Mom had C/S delivery. Mom is [employer].  Per mom she had two DEBP at home, [employer] and [place] pump. Infant did not latch in L&D, Mom made two attempts, per mom,  infant was sleepy at first , so she has been doing a lot of STS with infant. Mom is very dedicate in wanting breastfeeding to work with  her 4 th child. Per mom, she did not breastfed her first two children, she attempted to breastfed her 94rd child but she had latch difficulties but she pumped for one year. Her 3rd child is now 33 months old ( close spaced pregnancies). Mom attempted to latch infant on left breast using foot ball hold, at first infant would not latch, after suck training infant started  suckling on  LC's gloved finger and latched at mom's breast. Infant would hold breast in mouth at first, only take few suckles and stop, infant reluctant to breastfeed at this time. Mom will continue to do STS, and attempt to latch infant with hunger cues, mom knows to breastfeed infant with cues, 8 to 12 times within 24 hours and on demand. Mom taught back hand expression but colostrum currently is not present. Mom is willing to use DEBP and will pump every 3 hours both breast for 15 minutes on initial setting to help with breast stimulation and induction. Mom knows to call Nurse or LC if she has any further questions, concerns or need assistance with latching infant to breast. Reviewed Baby & Me book's Breastfeeding Basics.  Mom made aware of O/P services, breastfeeding support groups, community resources, and our phone # for post-discharge questions.   Maternal Data Formula Feeding for Exclusion: Yes Reason for exclusion: Mother's choice to formula and breast feed on admission Has patient been taught Hand  Expression?: Yes(Colostrum not present currently.) Does the patient have breastfeeding experience prior to this delivery?: Yes  Feeding Feeding Type: Breast Fed  LATCH Score Latch: Repeated attempts needed to sustain latch, nipple held in mouth throughout feeding, stimulation needed to elicit sucking reflex.  Audible Swallowing: None  Type of Nipple: Everted at rest and after stimulation  Comfort (Breast/Nipple): Soft / non-tender  Hold (Positioning): Assistance needed to correctly position infant at breast and maintain latch.  LATCH Score: 6  Interventions Interventions: Breast feeding basics reviewed;Breast compression;Adjust position;Assisted with latch;DEBP;Support pillows;Skin to skin;Breast massage;Position options;Expressed milk;Hand express  Lactation Tools Discussed/Used Tools: Pump WIC Program: No Pump Review: Setup, frequency, and cleaning;Milk Storage Initiated by:: Vicente Serene, IBCLC Date initiated:: 08/04/19   Consult Status Consult Status: Follow-up Date: 08/05/19 Follow-up type: In-patient    Vicente Serene 08/04/2019, 8:59 PM

## 2019-08-05 ENCOUNTER — Encounter (HOSPITAL_COMMUNITY): Payer: Self-pay | Admitting: Obstetrics and Gynecology

## 2019-08-05 LAB — CBC
HCT: 30.8 % — ABNORMAL LOW (ref 36.0–46.0)
Hemoglobin: 10.4 g/dL — ABNORMAL LOW (ref 12.0–15.0)
MCH: 30.7 pg (ref 26.0–34.0)
MCHC: 33.8 g/dL (ref 30.0–36.0)
MCV: 90.9 fL (ref 80.0–100.0)
Platelets: 232 10*3/uL (ref 150–400)
RBC: 3.39 MIL/uL — ABNORMAL LOW (ref 3.87–5.11)
RDW: 12.7 % (ref 11.5–15.5)
WBC: 11.7 10*3/uL — ABNORMAL HIGH (ref 4.0–10.5)
nRBC: 0 % (ref 0.0–0.2)

## 2019-08-05 NOTE — Lactation Note (Signed)
This note was copied from a baby's chart. Lactation Consultation Note  Patient Name: Girl Shantavia Sandridge Today's Date: 08/05/2019 Reason for consult: Follow-up assessment;Early term 37-38.6wks;1st time breastfeeding  P4 mother whose infant is now 91 hours old.  Mother did not breast feed her first two children and pumped and bottle fed her third child (now 21 months).  Mother does not wish to pump and bottle feed with this child.  She has a sincere desire to breast feed.  Baby was asleep on mother's chest when I arrived.  Mother concerned that baby has been very sleepy.  Reassured mother that this is typical behavior for a baby at this age.  Also discussed the "ETI" as she relates to breast feeding.  Suggested mother continue hand expression before/after feedings to help increase milk supply.  She will pump at least every three hours and continue to attempt to latch prior to pumping.  Mother will feed back any EBM she obtains to baby via finger or spoon.  Praised her determination to make this a positive breast feeding experience and reassured her that she is currently doing all the necessary steps to help ensure a good milk supply.  Suggested lots of STS at any time.    Mother had a few questions regarding her DEBP but asked if I could return after STS and when baby is awake and ready to feed.  I will be back when mother calls for assistance.   Maternal Data    Feeding Feeding Type: Breast Fed  LATCH Score Latch: Too sleepy or reluctant, no latch achieved, no sucking elicited.  Audible Swallowing: None  Type of Nipple: Everted at rest and after stimulation  Comfort (Breast/Nipple): Soft / non-tender  Hold (Positioning): Assistance needed to correctly position infant at breast and maintain latch.  LATCH Score: 5  Interventions Interventions: Breast feeding basics reviewed;Assisted with latch;Skin to skin  Lactation Tools Discussed/Used     Consult Status Consult Status:  Follow-up Date: 08/05/19 Follow-up type: In-patient    Jeyren Danowski R Catlin Doria 08/05/2019, 12:03 PM

## 2019-08-05 NOTE — Progress Notes (Signed)
Subjective: Postpartum Day 1: Cesarean Delivery Patient reports incisional pain, tolerating PO, + flatus and no problems voiding.  Feels well today. Lochia scant. Denies HA/CP or SOB. Bonding well with baby - attempting breastfeeding.   Objective: Vital signs in last 24 hours: Temp:  [97 F (36.1 C)-98.2 F (36.8 C)] 97.9 F (36.6 C) (11/04 0915) Pulse Rate:  [56-108] 62 (11/04 0915) Resp:  [10-24] 18 (11/04 0915) BP: (104-146)/(53-89) 115/74 (11/04 0915) SpO2:  [95 %-100 %] 99 % (11/04 0915)  Physical Exam:  General: alert, cooperative and no distress Lochia: appropriate Uterine Fundus: firm Incision: no significant drainage DVT Evaluation: No evidence of DVT seen on physical exam. Calf/Ankle edema is present- mild  Recent Labs    08/04/19 0845 08/05/19 0448  HGB 11.7* 10.4*  HCT 35.6* 30.8*    Assessment/Plan: Status post Cesarean section. Doing well postoperatively.  Continue current care.  Isaiah Serge 08/05/2019, 9:43 AM

## 2019-08-05 NOTE — Progress Notes (Signed)
MOB was referred for history of depression/anxiety.  * Referral screened out by Clinical Social Worker because none of the following criteria appear to apply:  ~ History of anxiety/depression during this pregnancy, or of post-partum depression following prior delivery. ~ Diagnosis of anxiety and/or depression within last 3 years OR * MOB's symptoms currently being treated with medication and/or therapy.  Please contact the Clinical Social Worker if needs arise, by MOB request, or if MOB scores greater than 9/yes to question 10 on Edinburgh Postpartum Depression Screen.  Phillp Dolores, LCSWA  [facility] [phone]  

## 2019-08-05 NOTE — Op Note (Signed)
NAME: Virginia Carter, Virginia Carter MEDICAL RECORD [id number] ACCOUNT [id number] DATE OF BIRTH:[birth year] FACILITY: [facility] LOCATION: [facility] PHYSICIAN:Tiffny Gemmer BOVARD-STUCKERT, MD  OPERATIVE REPORT  DATE OF PROCEDURE:  08/04/2019  PREOPERATIVE DIAGNOSES: 1. Prolapsed cord. 2. Failed trial of labor after cesarean. 3. Stat cesarean section.  POSTOPERATIVE DIAGNOSES: 1. Prolapsed cord. 2. Failed trial of labor after cesarean. 3. Stat cesarean section, delivered.  PROCEDURE:  Repeat low transverse cesarean section.  SURGEONS:  Loma Boston, DO with Janyth Contes, MD  ANESTHESIA:  Epidural and general anesthesia.  COMPLICATIONS:  None.  PATHOLOGY:  None.  SPECIMENS:  Placenta to labor and delivery.  ESTIMATED BLOOD LOSS:  Approximately 540 mL.  IV FLUIDS AND URINE OUTPUT:  Per anesthesia.  Clear urine at the end of the procedure.  DESCRIPTION OF PROCEDURE: I was called when the nurse checked the patient and had removed her Foley bulb and with that noted a cord prolapse.  She was taken to the OR at this time.  As she can move herself to the bed and the  heart rate was in the 70s, the decision was made to proceed with general anesthesia with her cesarean section.  The initial part of the cesarean section was performed by Loma Boston;  please see a separately dictated note.  I arrived as the uterus was  being closed.  It was closed with 2 layers of 0 Vicryl, the first of which was running locked and the second as an imbricating layer.  The gutters were cleared of all clot and debris.  The uterus was returned to the abdominal cavity.  The tubes were  confirmed to be normal, as were the ovaries bilaterally.  The peritoneum was reapproximated with 2-0 Vicryl in a running fashion.  Subfascial planes were inspected and found to be hemostatic. The fascia was reapproximated with a single suture of 0  Vicryl.  The subcuticular adipose layer was made hemostatic with Bovie cautery.  The dead space was  closed with 2-0 plain gut.  The skin was closed subcuticularly with 4-0 Vicryl on a Keith needle.  Benzoin and Steri-Strips were applied.  There had not  been time to perform a count prior to the section being performed.  Therefore, an x-ray was performed and cleared prior to taking the patient to the PACU.  She was awakened in stable condition with a clear x-ray.  She tolerated the procedure well.     VN/NUANCE  D:08/04/2019 T:08/04/2019 JOB:[id number]

## 2019-08-05 NOTE — Lactation Note (Signed)
This note was copied from a baby's chart. Lactation Consultation Note  Patient Name: Virginia Carter Today's Date: 08/05/2019 Reason for consult: Mother's request P4, 50 hour female infant. Per mom, infant was little spitty but is starting to latch. Mom latched infant on right breast using the football hold, infant sustained latch and was still breastfeeding after 12 minutes when LC left the room. Mom will continue to breastfeed infant according hunger cues, 8 to 12 times within 24 hours. Will call Nurse or [place] if she needs assistance with latching infant to breast.  Maternal Data    Feeding    LATCH Score Latch: Grasps breast easily, tongue down, lips flanged, rhythmical sucking.  Audible Swallowing: Spontaneous and intermittent  Type of Nipple: Everted at rest and after stimulation  Comfort (Breast/Nipple): Soft / non-tender  Hold (Positioning): Assistance needed to correctly position infant at breast and maintain latch.  LATCH Score: 9  Interventions Interventions: Assisted with latch;Skin to skin;Support pillows;Adjust position  Lactation Tools Discussed/Used     Consult Status Consult Status: Follow-up Date: 08/05/19 Follow-up type: In-patient    Vicente Serene 08/05/2019, 3:17 AM

## 2019-08-05 NOTE — Lactation Note (Signed)
This note was copied from a baby's chart. Lactation Consultation Note  Patient Name: Virginia Carter Today's Date: 08/05/2019 Reason for consult: Follow-up assessment;Early term 62-38.6wks  P4 mother whose infant is now 19 hours old.  Mother did not breast feed her first two children and pumped and bottle fed her third child (now 21 months).  Mother does not wish to pump and bottle feed this baby.  She desires to breast feed.  Spoke with RN prior to entering room.  RN spent much time trying to assist with breast feeding.  Baby was not able to latch and mother has not used the DEBP since our discussion around 1200 today.  Baby will start formula supplementation due to gestational age and not feeding yet.  RN initiated the paced bottle feeding with parents and provided the feeding supplementation guideline sheet.  Baby easily consumed 15 mls.  Discussed with mother the importance of consistently pumping with the DEBP after breast feeding.  Observed her pumping and the #24 flange size was changed to a #27 flange size for greater comfort and fit.  Asked RN to provide coconut oil on rounds and mother will use EBM and coconut oil on nipples/areolas.  Suggested she may want to use inside the flanges also.  Mother will breast feed at least every three hours tonight or sooner if baby shows cues.  While father provides the supplementation, mother will pump for 15 minutes.  She will feed back any EBM she obtains with pumping.  Mother will call for latch assistance as needed.  Discussed intermittent breast compressions during pumping and mother was able to return demonstrate.  RN updated.   Maternal Data Formula Feeding for Exclusion: Yes Reason for exclusion: Mother's choice to formula and breast feed on admission Has patient been taught Hand Expression?: Yes Does the patient have breastfeeding experience prior to this delivery?: No  Feeding Feeding Type: Bottle Fed - Formula Nipple Type: Slow -  flow  LATCH Score Latch: Grasps breast easily, tongue down, lips flanged, rhythmical sucking.  Audible Swallowing: None  Type of Nipple: Everted at rest and after stimulation  Comfort (Breast/Nipple): Filling, red/small blisters or bruises, mild/mod discomfort  Hold (Positioning): Assistance needed to correctly position infant at breast and maintain latch.  LATCH Score: 6  Interventions Interventions: Breast feeding basics reviewed;Assisted with latch;Skin to skin;Breast massage;Hand express;Reverse pressure;Breast compression;Adjust position;Support pillows;Position options;DEBP;Comfort gels  Lactation Tools Discussed/Used Tools: Pump;Comfort gels Breast pump type: Double-Electric Breast Pump Pump Review: Setup, frequency, and cleaning;Milk Storage(Reviewed) Initiated by:: Paul Dykes Date initiated:: 08/05/19   Consult Status Consult Status: Follow-up Date: 08/06/19 Follow-up type: In-patient    Little Ishikawa 08/05/2019, 6:38 PM

## 2019-08-06 ENCOUNTER — Encounter (HOSPITAL_COMMUNITY): Payer: Self-pay | Admitting: *Deleted

## 2019-08-06 MED ORDER — OXYCODONE HCL 5 MG PO TABS: 5.0000 mg | ORAL_TABLET | ORAL | 0 refills | Status: DC | PRN

## 2019-08-06 MED ORDER — ACETAMINOPHEN 500 MG PO TABS: 1000.0000 mg | ORAL_TABLET | Freq: Four times a day (QID) | ORAL | 0 refills | Status: AC

## 2019-08-06 MED ORDER — IBUPROFEN 800 MG PO TABS: 800.0000 mg | ORAL_TABLET | Freq: Three times a day (TID) | ORAL | 0 refills | Status: AC

## 2019-08-06 MED FILL — ACETAMINOPHEN 500MG XT STRE: 500 | 4 days supply | Qty: 30 | Fill #0

## 2019-08-06 MED FILL — oxyCODONE HCL 5 MG TABS: 5 | 4 days supply | Qty: 20 | Fill #0

## 2019-08-06 MED FILL — IBUPROFEN 800 MG TAB: 800 | 10 days supply | Qty: 30 | Fill #0

## 2019-08-06 NOTE — Lactation Note (Signed)
This note was copied from a baby's chart. Lactation Consultation Note:  P4, infant is 40 hours old. Infant is sleeping in fathers arms. Mother reports that her nipples are cracked and she has fear of latching infant. Mother reports that she has used a nipple shield with the last child. Mother doesn't want to use if possible.   Mother has bilateral cracks at the nipple shaft. She also has positional strips and scabs in the center of the nipple. Advised to get APNO.  Assist mother with placing infant in cross cradle and football hold. Infant latched using a tea-cup hold for only a few sucks at a time.   Observed that infant does have a high palate, tight posterior tongue restriction.  Discussed with mother. Mother was given handout with Specialist providers for evaluation.  Mother was fit with a #24 NS. Infant sleeping in fathers are not showing feeding cues.   Mother plans to continue to breastfeed and post pump . Mother has a pump at home. Mother to follow up with [facility] as outpatient.     Patient Name: Virginia Carter Today's Date: 08/06/2019 Reason for consult: Follow-up assessment   Maternal Data    Feeding Feeding Type: Breast Fed Nipple Type: Slow - flow  LATCH Score                   Interventions Interventions: Assisted with latch;Skin to skin;Hand express;Adjust position;Support pillows;Position options;Comfort gels;DEBP  Lactation Tools Discussed/Used Tools: Nipple Shields Nipple shield size: 24   Consult Status Consult Status: Follow-up Date: 08/07/19 Follow-up type: In-patient    Jess Barters [facility] 08/06/2019, 3:24 PM

## 2019-08-06 NOTE — Progress Notes (Signed)
Subjective: Postpartum Day 2: Stat Cesarean Delivery under general for cord prolapse Patient reports incisional pain.  She is eating and voiding fine.  Baby having issues with feedings  Objective: Vital signs in last 24 hours: Temp:  [97.4 F (36.3 C)-98.1 F (36.7 C)] 98.1 F (36.7 C) (11/05 0512) Pulse Rate:  [55-56] 55 (11/05 0512) Resp:  [18] 18 (11/05 0512) BP: (110-118)/(68-73) 118/68 (11/05 0512) SpO2:  [100 %] 100 % (11/05 0512)  Physical Exam:  General: alert and cooperative Lochia: appropriate Uterine Fundus: firm Incision: C/D/I  Recent Labs    08/04/19 0845 08/05/19 0448  HGB 11.7* 10.4*  HCT 35.6* 30.8*    Assessment/Plan: Status post Cesarean section. Doing well postoperatively.  Continue current care with lactation assistance. 36 hours from STAT c-section and working on feeds, patient will likely stay until tomorrow but will let me know if wants to go this PM  Logan Bores 08/06/2019, 9:35 AM

## 2019-08-06 NOTE — Discharge Summary (Signed)
OB Discharge Summary     Patient Name: Virginia Carter DOB: 1981/07/20 MRN: [id number]  Date of admission: 08/04/2019 Delivering MD: Levie Heritage   Date of discharge: 08/06/2019  Admitting diagnosis: induction Intrauterine pregnancy: [redacted]w[redacted]d     Secondary diagnosis:  Principal Problem:   Status post repeat low transverse cesarean section Active Problems:   PIH (pregnancy induced hypertension), third trimester Cord prolapse     Discharge diagnosis: Term Pregnancy Delivered                                                                                                Post partum procedures:none  Augmentation: Pitocin and Foley Balloon  Complications: Cord prolapse  Hospital course:  Induction of Labor With Cesarean Section  38 y.o. yo [id number] at [redacted]w[redacted]d was admitted to the hospital 08/04/2019 for induction of labor. Patient had a labor course significant for cord prolapse. The patient went for cesarean section due to Cord Prolapse, and delivered a Viable infant,08/04/2019  Membrane Rupture Time/Date: 10:37 AM ,08/04/2019   Details of operation can be found in separate operative Note.  Patient had an uncomplicated postpartum course. She is ambulating, tolerating a regular diet, passing flatus, and urinating well.  Patient is discharged home in stable condition on 08/06/19.                                    Physical exam  Vitals:   08/05/19 1427 08/05/19 1615 08/06/19 0512 08/06/19 1423  BP: 110/73  118/68 129/78  Pulse: (!) 56  (!) 55 71  Resp: 18  18 16   Temp: (!) 97.4 F (36.3 C) 97.7 F (36.5 C) 98.1 F (36.7 C) 98 F (36.7 C)  TempSrc: Oral Oral Oral Oral  SpO2:   100%   Weight:      Height:       General: alert and cooperative Lochia: appropriate Uterine Fundus: firm Incision: Dressing is clean, dry, and intact  Labs: Lab Results  Component Value Date   WBC 11.7 (H) 08/05/2019   HGB 10.4 (L) 08/05/2019   HCT 30.8 (L) 08/05/2019   MCV 90.9 08/05/2019   PLT 232  08/05/2019   CMP Latest Ref Rng & Units 08/04/2019  Glucose 70 - 99 mg/dL 13/11/2018)  BUN 6 - 20 mg/dL 7  Creatinine 683(M - 1.96 mg/dL 2.22  Sodium 9.79 - 892 mmol/L 135  Potassium 3.5 - 5.1 mmol/L 3.7  Chloride 98 - 111 mmol/L 107  CO2 22 - 32 mmol/L 19(L)  Calcium 8.9 - 10.3 mg/dL 9.0  Total Protein 6.5 - 8.1 g/dL 6.0(L)  Total Bilirubin 0.3 - 1.2 mg/dL 0.4  Alkaline Phos 38 - 126 U/L 136(H)  AST 15 - 41 U/L 18  ALT 0 - 44 U/L 14    Discharge instruction: per After Visit Summary and "Baby and Me Booklet".  After visit meds:  Allergies as of 08/06/2019      Reactions   Vicodin [hydrocodone-acetaminophen] Shortness Of Breath   Avocado Rash   Darvocet [  propoxyphene N-acetaminophen] Rash   Latex    Burning with condoms      Medication List    STOP taking these medications   cyclobenzaprine 10 MG tablet Commonly known as: FLEXERIL   fluticasone 50 MCG/ACT nasal spray Commonly known as: FLONASE   guaiFENesin 600 MG 12 hr tablet Commonly known as: MUCINEX     TAKE these medications   acetaminophen 500 MG tablet Commonly known as: TYLENOL Take 2 tablets (1,000 mg total) by mouth every 6 (six) hours. What changed:   medication strength  how much to take  when to take this  reasons to take this   clindamycin 1 % lotion Commonly known as: CLEOCIN T Apply 1 application topically 2 (two) times daily.   famotidine 20 MG tablet Commonly known as: PEPCID Take 20 mg by mouth 2 (two) times daily.   ibuprofen 800 MG tablet Commonly known as: ADVIL Take 1 tablet (800 mg total) by mouth every 8 (eight) hours. What changed:   medication strength  how much to take  when to take this   oxyCODONE 5 MG immediate release tablet Commonly known as: Oxy IR/ROXICODONE Take 1 tablet (5 mg total) by mouth every 4 (four) hours as needed for moderate pain. What changed: reasons to take this   prenatal multivitamin Tabs tablet Take 1 tablet by mouth daily.       Diet:  routine diet  Activity: Advance as tolerated. Pelvic rest for 6 weeks.   Outpatient follow up:2 weeks Follow up Appt:No future appointments. Follow up Visit:No follow-ups on file.  Postpartum contraception: Undecided  Newborn Data: Live born female  Birth Weight: 6 lb 13 oz (3090 g) APGAR: 32, 86  Newborn Delivery   Birth date/time: 08/04/2019 14:59:00 Delivery type: C-Section, Low Transverse Trial of labor: Yes C-section categorization: Repeat      Baby Feeding: Breast Disposition:home with mother   08/06/2019 Logan Bores, MD

## 2019-08-11 ENCOUNTER — Other Ambulatory Visit (HOSPITAL_COMMUNITY)
Admission: RE | Admit: 2019-08-11 | Discharge: 2019-08-11 | Disposition: A | Discharge status: Home / Self Care | Payer: 59 | Source: Ambulatory Visit | Attending: Obstetrics and Gynecology | Admitting: Obstetrics and Gynecology

## 2019-08-11 HISTORY — DX: Other specified health status: Z78.9

## 2019-08-13 ENCOUNTER — Inpatient Hospital Stay (HOSPITAL_COMMUNITY): Admit: 2019-08-13 | Payer: 59 | Admitting: Obstetrics and Gynecology

## 2019-09-23 DIAGNOSIS — Z3009 Encounter for other general counseling and advice on contraception: Secondary | ICD-10-CM | POA: Diagnosis not present

## 2019-09-23 DIAGNOSIS — Z1389 Encounter for screening for other disorder: Secondary | ICD-10-CM | POA: Diagnosis not present

## 2019-09-23 MED FILL — NORETHINDRONE 0.35 MG TABS: 0.35 | 84 days supply | Qty: 84 | Fill #0

## 2019-10-12 DIAGNOSIS — H52223 Regular astigmatism, bilateral: Secondary | ICD-10-CM | POA: Diagnosis not present

## 2019-10-12 DIAGNOSIS — H5213 Myopia, bilateral: Secondary | ICD-10-CM | POA: Diagnosis not present

## 2019-10-19 ENCOUNTER — Other Ambulatory Visit: Payer: 59

## 2019-10-20 DIAGNOSIS — Z20828 Contact with and (suspected) exposure to other viral communicable diseases: Secondary | ICD-10-CM | POA: Diagnosis not present

## 2019-10-20 DIAGNOSIS — Z03818 Encounter for observation for suspected exposure to other biological agents ruled out: Secondary | ICD-10-CM | POA: Diagnosis not present

## 2019-11-12 MED FILL — DICLOXACILLIN 500 MG CAP: 500 | 10 days supply | Qty: 30 | Fill #0

## 2020-01-13 ENCOUNTER — Ambulatory Visit: Payer: 59

## 2020-03-04 DIAGNOSIS — R7989 Other specified abnormal findings of blood chemistry: Secondary | ICD-10-CM | POA: Diagnosis not present

## 2020-03-04 DIAGNOSIS — Z0000 Encounter for general adult medical examination without abnormal findings: Secondary | ICD-10-CM | POA: Diagnosis not present

## 2020-03-09 DIAGNOSIS — E663 Overweight: Secondary | ICD-10-CM | POA: Diagnosis not present

## 2020-03-09 DIAGNOSIS — Z1339 Encounter for screening examination for other mental health and behavioral disorders: Secondary | ICD-10-CM | POA: Diagnosis not present

## 2020-03-09 DIAGNOSIS — Z1331 Encounter for screening for depression: Secondary | ICD-10-CM | POA: Diagnosis not present

## 2020-03-09 DIAGNOSIS — Z0000 Encounter for general adult medical examination without abnormal findings: Secondary | ICD-10-CM | POA: Diagnosis not present

## 2020-10-03 ENCOUNTER — Other Ambulatory Visit (HOSPITAL_BASED_OUTPATIENT_CLINIC_OR_DEPARTMENT_OTHER): Payer: Self-pay | Admitting: Internal Medicine

## 2020-10-03 ENCOUNTER — Telehealth: Payer: 59 | Admitting: Family

## 2020-10-03 DIAGNOSIS — J069 Acute upper respiratory infection, unspecified: Secondary | ICD-10-CM

## 2020-10-03 MED ORDER — FLUTICASONE PROPIONATE 50 MCG/ACT NA SUSP: 2.0000 | Freq: Every day | NASAL | 0 refills | Status: DC

## 2020-10-03 MED FILL — FLUTICASONE PROP 50 MCG SPR: 50 | 30 days supply | Qty: 16 | Fill #0

## 2020-10-03 MED FILL — CEFDINIR 300 MG CAPSULE: 300 | 7 days supply | Qty: 14 | Fill #0

## 2020-10-03 NOTE — Progress Notes (Signed)
We are sorry that you are not feeling well.  Here is how we plan to help!  Based on what you have shared with me it looks like you have sinusitis.  Sinusitis is inflammation and infection in the sinus cavities of the head.  Based on your presentation I believe you most likely have Acute Viral Sinusitis.This is an infection most likely caused by a virus. There is not specific treatment for viral sinusitis other than to help you with the symptoms until the infection runs its course.  You may use an oral decongestant such as Mucinex D or if you have glaucoma or high blood pressure use plain Mucinex. Saline nasal spray help and can safely be used as often as needed for congestion, I have prescribed: Fluticasone nasal spray two sprays in each nostril once a day  Some authorities believe that zinc sprays or the use of Echinacea may shorten the course of your symptoms.  Sinus infections are not as easily transmitted as other respiratory infection, however we still recommend that you avoid close contact with loved ones, especially the very young and elderly.  Remember to wash your hands thoroughly throughout the day as this is the number one way to prevent the spread of infection!  Home Care:  Only take medications as instructed by your medical team.  Do not take these medications with alcohol.  A steam or ultrasonic humidifier can help congestion.  You can place a towel over your head and breathe in the steam from hot water coming from a faucet.  Avoid close contacts especially the very young and the elderly.  Cover your mouth when you cough or sneeze.  Always remember to wash your hands.  Get Help Right Away If:  You develop worsening fever or sinus pain.  You develop a severe head ache or visual changes.  Your symptoms persist after you have completed your treatment plan.  Make sure you  Understand these instructions.  Will watch your condition.  Will get help right away if you are not  doing well or get worse.  Your e-visit answers were reviewed by a board certified advanced clinical practitioner to complete your personal care plan.  Depending on the condition, your plan could have included both over the counter or prescription medications.  If there is a problem please reply  once you have received a response from your provider.  Your safety is important to us.  If you have drug allergies check your prescription carefully.    You can use MyChart to ask questions about today's visit, request a non-urgent call back, or ask for a work or school excuse for 24 hours related to this e-Visit. If it has been greater than 24 hours you will need to follow up with your provider, or enter a new e-Visit to address those concerns.  You will get an e-mail in the next two days asking about your experience.  I hope that your e-visit has been valuable and will speed your recovery. Thank you for using e-visits.  Greater than 5 minutes, yet less than 10 minutes of time have been spent researching, coordinating, and implementing care for this patient today.  Thank you for the details you included in the comment boxes. Those details are very helpful in determining the best course of treatment for you and help us to provide the best care.  

## 2021-01-04 DIAGNOSIS — E663 Overweight: Secondary | ICD-10-CM | POA: Diagnosis not present

## 2021-01-05 ENCOUNTER — Other Ambulatory Visit (HOSPITAL_BASED_OUTPATIENT_CLINIC_OR_DEPARTMENT_OTHER): Payer: Self-pay

## 2021-01-05 MED ORDER — PHENTERMINE HCL 15 MG PO CAPS
ORAL_CAPSULE | ORAL | 3 refills | Status: DC
  Filled 2021-01-05: qty 30, 30d supply, fill #0
  Filled 2021-02-03: qty 30, 30d supply, fill #1
  Filled 2021-03-16: qty 30, 30d supply, fill #2
  Filled 2021-04-21: qty 30, 30d supply, fill #3

## 2021-02-03 ENCOUNTER — Other Ambulatory Visit (HOSPITAL_BASED_OUTPATIENT_CLINIC_OR_DEPARTMENT_OTHER): Payer: Self-pay

## 2021-03-14 DIAGNOSIS — Z0000 Encounter for general adult medical examination without abnormal findings: Secondary | ICD-10-CM | POA: Diagnosis not present

## 2021-03-17 ENCOUNTER — Other Ambulatory Visit (HOSPITAL_BASED_OUTPATIENT_CLINIC_OR_DEPARTMENT_OTHER): Payer: Self-pay

## 2021-03-29 ENCOUNTER — Other Ambulatory Visit (HOSPITAL_BASED_OUTPATIENT_CLINIC_OR_DEPARTMENT_OTHER): Payer: Self-pay

## 2021-03-29 DIAGNOSIS — E663 Overweight: Secondary | ICD-10-CM | POA: Diagnosis not present

## 2021-03-29 DIAGNOSIS — F418 Other specified anxiety disorders: Secondary | ICD-10-CM | POA: Diagnosis not present

## 2021-03-29 DIAGNOSIS — Z1331 Encounter for screening for depression: Secondary | ICD-10-CM | POA: Diagnosis not present

## 2021-03-29 DIAGNOSIS — K219 Gastro-esophageal reflux disease without esophagitis: Secondary | ICD-10-CM | POA: Diagnosis not present

## 2021-03-29 DIAGNOSIS — Z1339 Encounter for screening examination for other mental health and behavioral disorders: Secondary | ICD-10-CM | POA: Diagnosis not present

## 2021-03-29 DIAGNOSIS — R82998 Other abnormal findings in urine: Secondary | ICD-10-CM | POA: Diagnosis not present

## 2021-03-29 DIAGNOSIS — M4302 Spondylolysis, cervical region: Secondary | ICD-10-CM | POA: Diagnosis not present

## 2021-03-29 DIAGNOSIS — Z0000 Encounter for general adult medical examination without abnormal findings: Secondary | ICD-10-CM | POA: Diagnosis not present

## 2021-03-29 MED ORDER — ALPRAZOLAM 0.5 MG PO TABS
ORAL_TABLET | ORAL | 0 refills | Status: AC
  Filled 2021-03-29 – 2021-04-21 (×2): qty 30, 30d supply, fill #0

## 2021-03-29 MED ORDER — METHOCARBAMOL 500 MG PO TABS
ORAL_TABLET | ORAL | 0 refills | Status: AC
  Filled 2021-03-29: qty 30, 30d supply, fill #0
  Filled 2021-04-21: qty 30, 10d supply, fill #0

## 2021-04-07 ENCOUNTER — Other Ambulatory Visit (HOSPITAL_BASED_OUTPATIENT_CLINIC_OR_DEPARTMENT_OTHER): Payer: Self-pay

## 2021-04-21 ENCOUNTER — Other Ambulatory Visit (HOSPITAL_BASED_OUTPATIENT_CLINIC_OR_DEPARTMENT_OTHER): Payer: Self-pay

## 2021-04-21 MED ORDER — ESCITALOPRAM OXALATE 10 MG PO TABS
ORAL_TABLET | ORAL | 2 refills | Status: AC
  Filled 2021-04-21 – 2021-12-25 (×3): qty 90, 90d supply, fill #0

## 2021-06-09 DIAGNOSIS — H5213 Myopia, bilateral: Secondary | ICD-10-CM | POA: Diagnosis not present

## 2021-06-12 IMAGING — DX DG ABD PORTABLE 1V
1 series · 1 of 1 positions shown · non-contrast
Comparison: None.

CLINICAL DATA: Postoperative film for Caesarean section, no count

EXAM:
PORTABLE ABDOMEN - 1 VIEW

[abdomen]
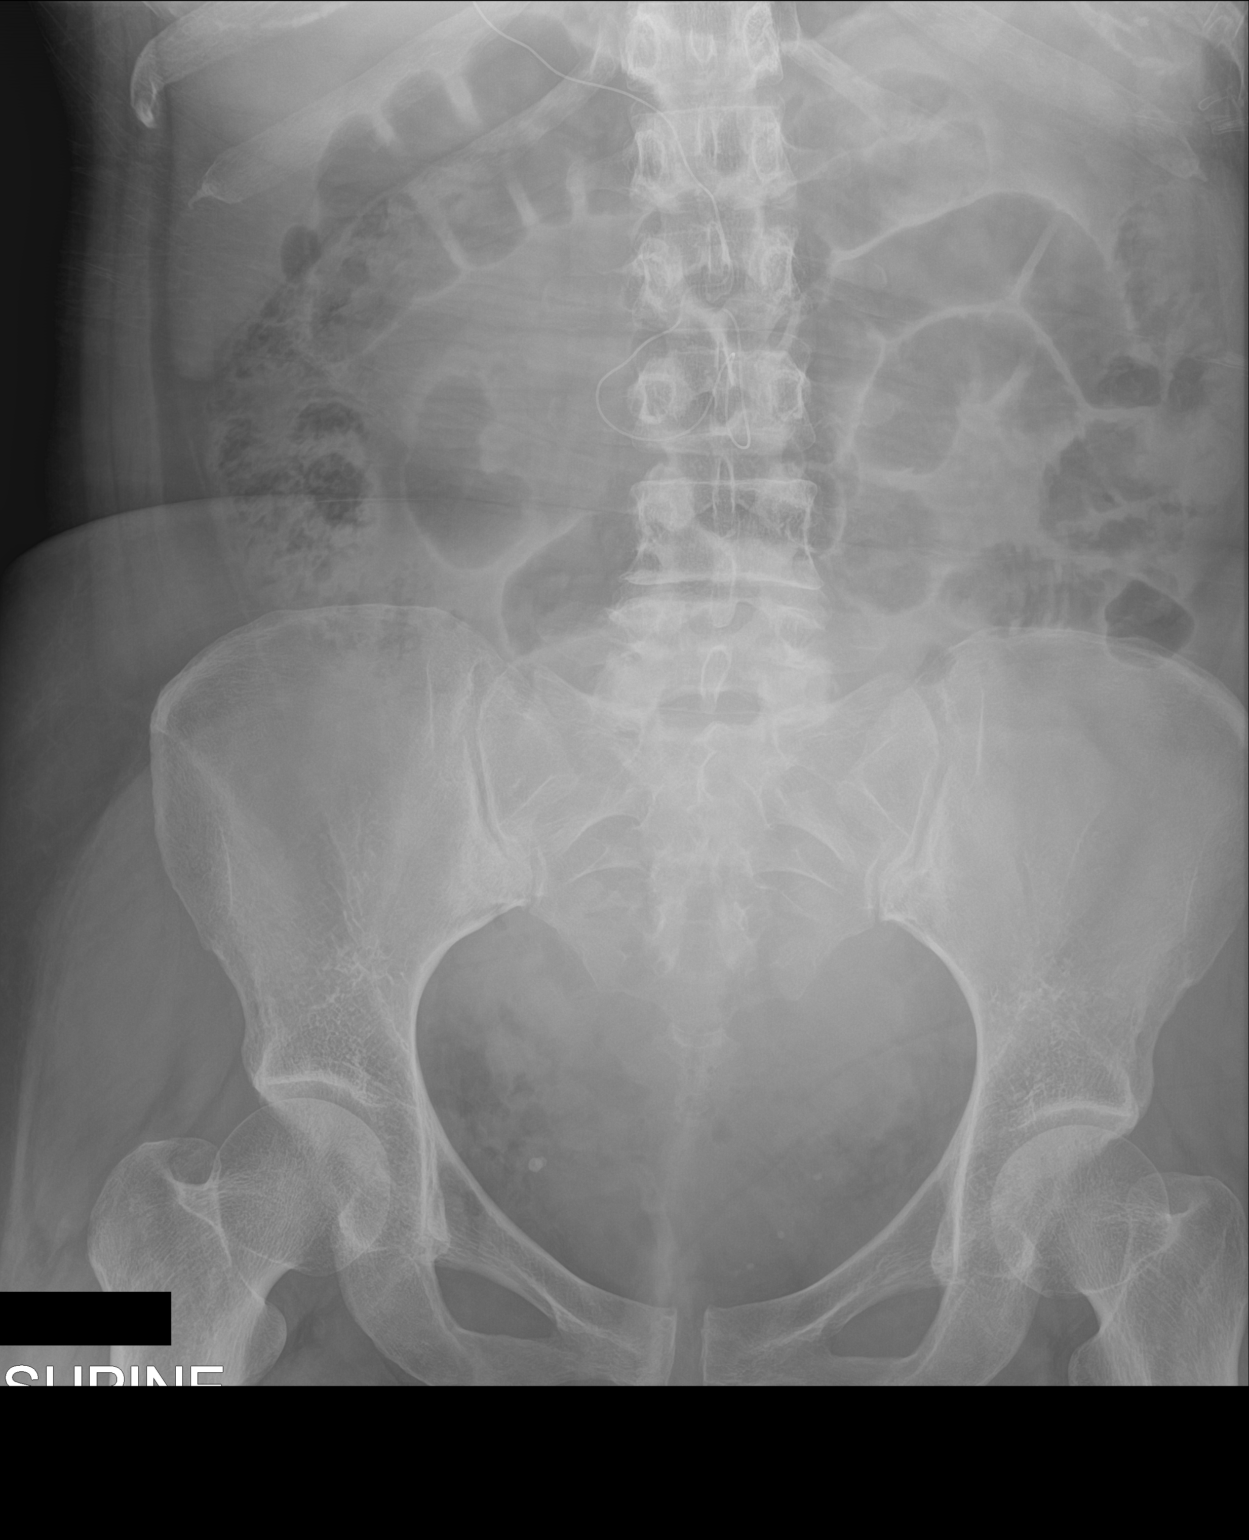

[1 of 1 positions shown; findings below may reference images not displayed]

FINDINGS: No unexpected radiopaque foreign body is seen. Linear tubing
coursing to midline compatible with an epidural pain catheter.
Nonobstructive bowel gas pattern. Mild diastasis of the symphysis
pubis may be physiologic in the recent postpartum state. Remaining
osseous structures are unremarkable.
IMPRESSION: No unexpected radiopaque foreign body. Epidural pain catheter
remains in place.

These results were called by telephone at the time of interpretation
on 08/04/2019 at [DATE] to provider RN Joseleonardo Dumar, who verbally
acknowledged these results.

## 2021-07-14 ENCOUNTER — Other Ambulatory Visit (HOSPITAL_BASED_OUTPATIENT_CLINIC_OR_DEPARTMENT_OTHER): Payer: Self-pay

## 2021-07-14 MED ORDER — PHENTERMINE HCL 15 MG PO CAPS
ORAL_CAPSULE | ORAL | 0 refills | Status: AC
  Filled 2021-07-14: qty 30, 30d supply, fill #0

## 2021-07-17 ENCOUNTER — Other Ambulatory Visit: Payer: Self-pay

## 2021-07-17 MED ORDER — METHOCARBAMOL 750 MG PO TABS
ORAL_TABLET | ORAL | 0 refills | Status: AC
  Filled 2021-07-17: qty 30, 10d supply, fill #0

## 2021-07-27 ENCOUNTER — Other Ambulatory Visit (HOSPITAL_BASED_OUTPATIENT_CLINIC_OR_DEPARTMENT_OTHER): Payer: Self-pay

## 2021-07-27 MED ORDER — INFLUENZA VAC SPLIT QUAD 0.5 ML IM SUSY
PREFILLED_SYRINGE | INTRAMUSCULAR | 0 refills | Status: AC
  Filled 2021-07-27: qty 0.5, 1d supply, fill #0

## 2021-08-20 ENCOUNTER — Other Ambulatory Visit: Payer: Self-pay

## 2021-08-20 ENCOUNTER — Ambulatory Visit
Admission: EM | Admit: 2021-08-20 | Discharge: 2021-08-20 | Disposition: A | Discharge status: Home / Self Care | Payer: 59 | Attending: Family Medicine | Admitting: Family Medicine

## 2021-08-20 DIAGNOSIS — Z20828 Contact with and (suspected) exposure to other viral communicable diseases: Secondary | ICD-10-CM | POA: Diagnosis not present

## 2021-08-20 DIAGNOSIS — J069 Acute upper respiratory infection, unspecified: Secondary | ICD-10-CM | POA: Diagnosis not present

## 2021-08-20 DIAGNOSIS — Z20822 Contact with and (suspected) exposure to covid-19: Secondary | ICD-10-CM

## 2021-08-20 MED ORDER — PROMETHAZINE-DM 6.25-15 MG/5ML PO SYRP: 5.0000 mL | ORAL_SOLUTION | Freq: Four times a day (QID) | ORAL | 0 refills | Status: AC | PRN

## 2021-08-20 MED ORDER — DEXAMETHASONE SODIUM PHOSPHATE 10 MG/ML IJ SOLN
10.0000 mg | Freq: Once | INTRAMUSCULAR | Status: AC
  Administered 2021-08-20: 10 mg via INTRAMUSCULAR

## 2021-08-20 NOTE — ED Triage Notes (Signed)
Patient states her daughter tested positive for the Flu on Thursday and then she started feeling bad with cough, congestion and throat on fire. She states she can taste blood . Both ears are also hurting. Taking robitussin and sudafed last dose this morning.  Denies Fever.

## 2021-08-20 NOTE — ED Provider Notes (Signed)
[facility]    CSN: [id number] Arrival date & time: 08/20/21  3295      History   Chief Complaint No chief complaint on file.   HPI Virginia Carter is a 40 y.o. female.   Patient presenting today with 5-day history of hacking cough, congestion, significant sore throat, ear pressure and pain, fatigue, body aches.  Denies known fever, chills, chest pain, shortness of breath, abdominal pain, nausea vomiting or diarrhea.  So far taking Robitussin and Sudafed with minimal temporary relief of symptoms.  Daughter sick with the flu at home.  No known pertinent chronic medical problems.   Past Medical History:  Diagnosis Date   Medical history non-contributory    No pertinent past medical history     Patient Active Problem List   Diagnosis Date Noted   PIH (pregnancy induced hypertension), third trimester 08/04/2019   Status post repeat low transverse cesarean section 08/04/2019   Status post primary low transverse cesarean section 11/05/2017   Postpartum care following cesarean delivery 11/05/2017    Past Surgical History:  Procedure Laterality Date   CESAREAN SECTION N/A 11/05/2017   Procedure: CESAREAN SECTION;  Surgeon: Edwinna Areola, DO;  Location: [facility];  Service: Obstetrics;  Laterality: N/A;   CESAREAN SECTION N/A 08/04/2019   Procedure: CESAREAN SECTION;  Surgeon: Sherian Rein, MD;  Location: [facility];  Service: Obstetrics;  Laterality: N/A;  Tracey RNFA   WISDOM TOOTH EXTRACTION  2001    OB History     Gravida  5   Para  4   Term  3   Preterm      AB  1   Living  4      SAB  1   IAB      Ectopic      Multiple  0   Live Births  4            Home Medications    Prior to Admission medications   Medication Sig Start Date End Date Taking? Authorizing Provider  promethazine-dextromethorphan (PROMETHAZINE-DM) 6.25-15 MG/5ML syrup Take 5 mLs by mouth 4 (four) times daily as needed. 08/20/21  Yes Particia Nearing, PA-C  acetaminophen (TYLENOL) 500 MG tablet Take 2 tablets (1,000 mg total) by mouth every 6 (six) hours. 08/06/19   Huel Cote, MD  ALPRAZolam Prudy Feeler) 0.5 MG tablet Take 1 tablet by mouth every day 03/29/21     cefdinir (OMNICEF) 300 MG capsule TAKE 1 CAPSULE BY MOUTH TWICE DAILY FOR 1 WEEK 10/03/20 10/03/21  Tisovec, Adelfa Koh, MD  clindamycin (CLEOCIN T) 1 % lotion Apply 1 application topically 2 (two) times daily.  08/26/17   [provider]  escitalopram (LEXAPRO) 10 MG tablet Take1 tablet by mouth once daily 04/21/21     famotidine (PEPCID) 20 MG tablet Take 20 mg by mouth 2 (two) times daily.    [provider]  fluticasone (FLONASE) 50 MCG/ACT nasal spray PLACE 2 SPRAYS INTO BOTH NOSTRILS DAILY 10/03/20 10/03/21  Worthy Rancher B, FNP  ibuprofen (ADVIL) 800 MG tablet Take 1 tablet (800 mg total) by mouth every 8 (eight) hours. 08/06/19   Huel Cote, MD  influenza vac split quadrivalent PF (FLUARIX) 0.5 ML injection Inject into the muscle. 07/27/21   Judyann Munson, MD  methocarbamol (ROBAXIN) 500 MG tablet Take 1 tablet by mouth every 8 hours 03/29/21     methocarbamol (ROBAXIN) 750 MG tablet 1 tablet Orally every 8 hrs 30 day(s) 07/17/21  oxyCODONE (OXY IR/ROXICODONE) 5 MG immediate release tablet Take 1 tablet (5 mg total) by mouth every 4 (four) hours as needed for moderate pain. 08/06/19   Huel Cote, MD  phentermine 15 MG capsule Take 1 capsule by mouth once daily 07/14/21     Prenatal Vit-Fe Fumarate-FA (PRENATAL MULTIVITAMIN) TABS Take 1 tablet by mouth daily.    [provider]    Family History Family History  Problem Relation Age of Onset   Diabetes Mother    Arthritis Mother     Social History Social History   Tobacco Use   Smoking status: Former   Smokeless tobacco: Never  Building services engineer Use: Never used  Substance Use Topics   Alcohol use: Yes    Comment: Occassionally   Drug use: No     Allergies    Vicodin [hydrocodone-acetaminophen], Avocado, Darvocet [propoxyphene n-acetaminophen], and Latex   Review of Systems Review of Systems Per HPI  Physical Exam Triage Vital Signs ED Triage Vitals  Enc Vitals Group     BP 08/20/21 1127 139/90     Pulse Rate 08/20/21 1127 66     Resp 08/20/21 1127 16     Temp 08/20/21 1127 98.7 F (37.1 C)     Temp Source 08/20/21 1127 Oral     SpO2 08/20/21 1127 98 %     Weight --      Height --      Head Circumference --      Peak Flow --      Pain Score 08/20/21 1123 0     Pain Loc --      Pain Edu? --      Excl. in GC? --    No data found.  Updated Vital Signs BP 139/90 (BP Location: Right Arm)   Pulse 66   Temp 98.7 F (37.1 C) (Oral)   Resp 16   LMP 07/24/2021 (Approximate)   SpO2 98%   Breastfeeding No   Visual Acuity Right Eye Distance:   Left Eye Distance:   Bilateral Distance:    Right Eye Near:   Left Eye Near:    Bilateral Near:     Physical Exam Vitals and nursing note reviewed.  Constitutional:      Appearance: Normal appearance.  HENT:     Head: Atraumatic.     Right Ear: Tympanic membrane and external ear normal.     Left Ear: Tympanic membrane and external ear normal.     Nose: Rhinorrhea present.     Mouth/Throat:     Mouth: Mucous membranes are moist.     Pharynx: Posterior oropharyngeal erythema present.  Eyes:     Extraocular Movements: Extraocular movements intact.     Conjunctiva/sclera: Conjunctivae normal.  Cardiovascular:     Rate and Rhythm: Normal rate and regular rhythm.     Heart sounds: Normal heart sounds.  Pulmonary:     Effort: Pulmonary effort is normal.     Breath sounds: Normal breath sounds. No wheezing or rales.  Musculoskeletal:        General: Normal range of motion.     Cervical back: Normal range of motion and neck supple.  Skin:    General: Skin is warm and dry.  Neurological:     Mental Status: She is alert and oriented to person, place, and time.  Psychiatric:         Mood and Affect: Mood normal.        Thought Content: Thought  content normal.     UC Treatments / Results  Labs (all labs ordered are listed, but only abnormal results are displayed) Labs Reviewed  COVID-19, FLU A+B NAA    EKG   Radiology No results found.  Procedures Procedures (including critical care time)  Medications Ordered in UC Medications  dexamethasone (DECADRON) injection 10 mg (10 mg Intramuscular Given 08/20/21 1235)    Initial Impression / Assessment and Plan / UC Course  I have reviewed the triage vital signs and the nursing notes.  Pertinent labs & imaging results that were available during my care of the patient were reviewed by me and considered in my medical decision making (see chart for details).     Vitals and exam overall reassuring and indicative of a viral upper respiratory infection, highly suspicious for influenza given home exposure to sick contact with influenza and symptoms.  We will treat with IM Decadron, Phenergan DM for cough, discussed Chloraseptic spray and over-the-counter pain relievers for additional symptomatic benefit.  Supportive home care and return precautions reviewed.  Work note given.  Final Clinical Impressions(s) / UC Diagnoses   Final diagnoses:  Viral URI with cough  Exposure to influenza   Discharge Instructions   None    ED Prescriptions     Medication Sig Dispense Auth. Provider   promethazine-dextromethorphan (PROMETHAZINE-DM) 6.25-15 MG/5ML syrup Take 5 mLs by mouth 4 (four) times daily as needed. 100 mL Particia Nearing, [place]      PDMP not reviewed this encounter.   Particia Nearing, [place] 08/20/21 1247

## 2021-08-21 LAB — COVID-19, FLU A+B NAA
Influenza A, NAA: DETECTED — AB
Influenza B, NAA: NOT DETECTED
SARS-CoV-2, NAA: NOT DETECTED

## 2021-08-22 ENCOUNTER — Other Ambulatory Visit (HOSPITAL_COMMUNITY): Payer: Self-pay

## 2021-08-22 ENCOUNTER — Other Ambulatory Visit (HOSPITAL_BASED_OUTPATIENT_CLINIC_OR_DEPARTMENT_OTHER): Payer: Self-pay

## 2021-08-22 MED ORDER — BALOXAVIR MARBOXIL(40 MG DOSE) 1 X 40 MG PO TBPK
ORAL_TABLET | ORAL | 0 refills | Status: AC
  Filled 2021-08-22: qty 1, 1d supply, fill #0

## 2021-08-22 MED ORDER — XOFLUZA (80 MG DOSE) 1 X 80 MG PO TBPK
ORAL_TABLET | ORAL | 0 refills | Status: DC
  Filled 2021-08-22 (×2): qty 1, 1d supply, fill #0

## 2021-08-30 ENCOUNTER — Other Ambulatory Visit: Payer: Self-pay

## 2021-08-30 MED ORDER — PENICILLIN V POTASSIUM 500 MG PO TABS
ORAL_TABLET | ORAL | 0 refills | Status: AC
  Filled 2021-08-30: qty 30, 10d supply, fill #0

## 2021-11-02 ENCOUNTER — Encounter (HOSPITAL_BASED_OUTPATIENT_CLINIC_OR_DEPARTMENT_OTHER): Payer: Self-pay | Admitting: Pharmacist

## 2021-11-02 ENCOUNTER — Other Ambulatory Visit (HOSPITAL_BASED_OUTPATIENT_CLINIC_OR_DEPARTMENT_OTHER): Payer: Self-pay

## 2021-11-07 ENCOUNTER — Other Ambulatory Visit: Payer: Self-pay

## 2021-11-07 MED ORDER — GENTAMICIN SULFATE 0.3 % OP SOLN
OPHTHALMIC | 0 refills | Status: AC
  Filled 2021-11-07: qty 5, 5d supply, fill #0

## 2021-11-08 ENCOUNTER — Other Ambulatory Visit: Payer: Self-pay

## 2021-11-09 ENCOUNTER — Telehealth: Payer: 59 | Admitting: Physician Assistant

## 2021-11-09 ENCOUNTER — Other Ambulatory Visit: Payer: Self-pay

## 2021-11-09 DIAGNOSIS — J069 Acute upper respiratory infection, unspecified: Secondary | ICD-10-CM

## 2021-11-09 DIAGNOSIS — B9689 Other specified bacterial agents as the cause of diseases classified elsewhere: Secondary | ICD-10-CM

## 2021-11-09 MED ORDER — BENZONATATE 100 MG PO CAPS
100.0000 mg | ORAL_CAPSULE | Freq: Three times a day (TID) | ORAL | 0 refills | Status: AC | PRN
  Filled 2021-11-09: qty 30, 10d supply, fill #0

## 2021-11-09 MED ORDER — AMOXICILLIN-POT CLAVULANATE 875-125 MG PO TABS
1.0000 | ORAL_TABLET | Freq: Two times a day (BID) | ORAL | 0 refills | Status: AC
  Filled 2021-11-09: qty 14, 7d supply, fill #0

## 2021-11-09 NOTE — Progress Notes (Signed)
E-Visit for Sinus Problems  We are sorry that you are not feeling well.  Here is how we plan to help!  Based on what you have shared with me it looks like you have sinusitis.  Sinusitis is inflammation and infection in the sinus cavities of the head.  Based on your presentation I believe you most likely have Acute Bacterial Sinusitis.  This is an infection caused by bacteria and is treated with antibiotics. I have prescribed Augmentin 875mg /125mg  one tablet twice daily with food, for 7 days. Tessalon perles 100mg  take 1 capsule every 8 hours as needed for cough. You may use an oral decongestant such as Mucinex D or if you have glaucoma or high blood pressure use plain Mucinex. Saline nasal spray help and can safely be used as often as needed for congestion.  If you develop worsening sinus pain, fever or notice severe headache and vision changes, or if symptoms are not better after completion of antibiotic, please schedule an appointment with a health care provider.    Sinus infections are not as easily transmitted as other respiratory infection, however we still recommend that you avoid close contact with loved ones, especially the very young and elderly.  Remember to wash your hands thoroughly throughout the day as this is the number one way to prevent the spread of infection!  Home Care: Only take medications as instructed by your medical team. Complete the entire course of an antibiotic. Do not take these medications with alcohol. A steam or ultrasonic humidifier can help congestion.  You can place a towel over your head and breathe in the steam from hot water coming from a faucet. Avoid close contacts especially the very young and the elderly. Cover your mouth when you cough or sneeze. Always remember to wash your hands.  Get Help Right Away If: You develop worsening fever or sinus pain. You develop a severe head ache or visual changes. Your symptoms persist after you have completed your  treatment plan.  Make sure you Understand these instructions. Will watch your condition. Will get help right away if you are not doing well or get worse.  Thank you for choosing an e-visit.  Your e-visit answers were reviewed by a board certified advanced clinical practitioner to complete your personal care plan. Depending upon the condition, your plan could have included both over the counter or prescription medications.  Please review your pharmacy choice. Make sure the pharmacy is open so you can pick up prescription now. If there is a problem, you may contact your provider through and have the prescription routed to another pharmacy.  Your safety is important to . If you have drug allergies check your prescription carefully.   For the next 24 hours you can use MyChart to ask questions about today's visit, request a non-urgent call back, or ask for a work or school excuse. You will get an email in the next two days asking about your experience. I hope that your e-visit has been valuable and will speed your recovery.  I provided 5 minutes of non face-to-face time during this encounter for chart review and documentation.

## 2021-11-13 ENCOUNTER — Other Ambulatory Visit (HOSPITAL_BASED_OUTPATIENT_CLINIC_OR_DEPARTMENT_OTHER): Payer: Self-pay

## 2021-11-13 ENCOUNTER — Encounter (HOSPITAL_BASED_OUTPATIENT_CLINIC_OR_DEPARTMENT_OTHER): Payer: Self-pay | Admitting: Pharmacist

## 2021-11-13 DIAGNOSIS — H1033 Unspecified acute conjunctivitis, bilateral: Secondary | ICD-10-CM | POA: Diagnosis not present

## 2021-11-13 DIAGNOSIS — H15103 Unspecified episcleritis, bilateral: Secondary | ICD-10-CM | POA: Diagnosis not present

## 2021-11-13 MED ORDER — ZYLET 0.5-0.3 % OP SUSP
OPHTHALMIC | 0 refills | Status: AC
  Filled 2021-11-13: qty 5, 10d supply, fill #0

## 2021-12-25 ENCOUNTER — Other Ambulatory Visit: Payer: Self-pay

## 2021-12-25 MED ORDER — ALPRAZOLAM 0.5 MG PO TABS
ORAL_TABLET | ORAL | 0 refills | Status: DC
  Filled 2021-12-25: qty 30, 30d supply, fill #0

## 2021-12-25 MED ORDER — METHOCARBAMOL 750 MG PO TABS
ORAL_TABLET | ORAL | 0 refills | Status: DC
  Filled 2021-12-25: qty 30, 10d supply, fill #0

## 2021-12-26 ENCOUNTER — Other Ambulatory Visit: Payer: Self-pay

## 2022-02-08 ENCOUNTER — Encounter (HOSPITAL_BASED_OUTPATIENT_CLINIC_OR_DEPARTMENT_OTHER): Payer: Self-pay

## 2022-02-08 ENCOUNTER — Other Ambulatory Visit (HOSPITAL_BASED_OUTPATIENT_CLINIC_OR_DEPARTMENT_OTHER): Payer: Self-pay

## 2022-02-08 DIAGNOSIS — E663 Overweight: Secondary | ICD-10-CM | POA: Diagnosis not present

## 2022-02-08 MED ORDER — WEGOVY 2.4 MG/0.75ML ~~LOC~~ SOAJ: SUBCUTANEOUS | 0 refills | Status: AC

## 2022-02-08 MED ORDER — WEGOVY 0.25 MG/0.5ML ~~LOC~~ SOAJ
SUBCUTANEOUS | 0 refills | Status: AC
  Filled 2022-02-08: qty 2, 28d supply, fill #0

## 2022-02-08 MED ORDER — WEGOVY 1.7 MG/0.75ML ~~LOC~~ SOAJ: SUBCUTANEOUS | 0 refills | Status: AC

## 2022-02-08 MED ORDER — WEGOVY 0.5 MG/0.5ML ~~LOC~~ SOAJ: SUBCUTANEOUS | 0 refills | Status: AC

## 2022-02-08 MED ORDER — WEGOVY 1 MG/0.5ML ~~LOC~~ SOAJ: SUBCUTANEOUS | 0 refills | Status: AC

## 2022-02-09 ENCOUNTER — Other Ambulatory Visit (HOSPITAL_BASED_OUTPATIENT_CLINIC_OR_DEPARTMENT_OTHER): Payer: Self-pay

## 2022-02-13 ENCOUNTER — Other Ambulatory Visit (HOSPITAL_BASED_OUTPATIENT_CLINIC_OR_DEPARTMENT_OTHER): Payer: Self-pay

## 2022-02-16 ENCOUNTER — Other Ambulatory Visit (HOSPITAL_BASED_OUTPATIENT_CLINIC_OR_DEPARTMENT_OTHER): Payer: Self-pay

## 2022-02-16 MED ORDER — PHENTERMINE HCL 37.5 MG PO TABS
ORAL_TABLET | ORAL | 0 refills | Status: AC
  Filled 2022-02-16: qty 90, 90d supply, fill #0

## 2022-02-19 ENCOUNTER — Other Ambulatory Visit (HOSPITAL_BASED_OUTPATIENT_CLINIC_OR_DEPARTMENT_OTHER): Payer: Self-pay

## 2022-03-09 ENCOUNTER — Other Ambulatory Visit (HOSPITAL_BASED_OUTPATIENT_CLINIC_OR_DEPARTMENT_OTHER): Payer: Self-pay

## 2022-03-20 ENCOUNTER — Other Ambulatory Visit (HOSPITAL_BASED_OUTPATIENT_CLINIC_OR_DEPARTMENT_OTHER): Payer: Self-pay

## 2022-03-20 DIAGNOSIS — L710 Perioral dermatitis: Secondary | ICD-10-CM | POA: Diagnosis not present

## 2022-03-20 MED ORDER — DOXYCYCLINE HYCLATE 100 MG PO CAPS
100.0000 mg | ORAL_CAPSULE | Freq: Every day | ORAL | 1 refills | Status: DC
  Filled 2022-03-20: qty 30, 30d supply, fill #0

## 2022-03-20 MED ORDER — CLINDAMYCIN PHOSPHATE 1 % EX LOTN
TOPICAL_LOTION | CUTANEOUS | 1 refills | Status: AC
  Filled 2022-03-20 (×2): qty 60, 30d supply, fill #0
  Filled 2023-03-05: qty 60, 30d supply, fill #1

## 2022-03-20 MED ORDER — METHOCARBAMOL 750 MG PO TABS
ORAL_TABLET | ORAL | 0 refills | Status: DC
  Filled 2022-03-20: qty 30, 10d supply, fill #0

## 2022-03-20 MED ORDER — ALPRAZOLAM 0.5 MG PO TABS
ORAL_TABLET | ORAL | 0 refills | Status: DC
  Filled 2022-03-20: qty 30, 30d supply, fill #0

## 2022-03-21 ENCOUNTER — Other Ambulatory Visit (HOSPITAL_BASED_OUTPATIENT_CLINIC_OR_DEPARTMENT_OTHER): Payer: Self-pay

## 2022-04-06 ENCOUNTER — Other Ambulatory Visit (HOSPITAL_BASED_OUTPATIENT_CLINIC_OR_DEPARTMENT_OTHER): Payer: Self-pay

## 2022-04-13 DIAGNOSIS — Z0000 Encounter for general adult medical examination without abnormal findings: Secondary | ICD-10-CM | POA: Diagnosis not present

## 2022-04-13 DIAGNOSIS — R7989 Other specified abnormal findings of blood chemistry: Secondary | ICD-10-CM | POA: Diagnosis not present

## 2022-04-20 ENCOUNTER — Other Ambulatory Visit (HOSPITAL_BASED_OUTPATIENT_CLINIC_OR_DEPARTMENT_OTHER): Payer: Self-pay

## 2022-04-20 DIAGNOSIS — F418 Other specified anxiety disorders: Secondary | ICD-10-CM | POA: Diagnosis not present

## 2022-04-20 DIAGNOSIS — R82998 Other abnormal findings in urine: Secondary | ICD-10-CM | POA: Diagnosis not present

## 2022-04-20 DIAGNOSIS — Z1331 Encounter for screening for depression: Secondary | ICD-10-CM | POA: Diagnosis not present

## 2022-04-20 DIAGNOSIS — Z0000 Encounter for general adult medical examination without abnormal findings: Secondary | ICD-10-CM | POA: Diagnosis not present

## 2022-04-20 DIAGNOSIS — Z1389 Encounter for screening for other disorder: Secondary | ICD-10-CM | POA: Diagnosis not present

## 2022-04-20 DIAGNOSIS — E663 Overweight: Secondary | ICD-10-CM | POA: Diagnosis not present

## 2022-04-20 DIAGNOSIS — K219 Gastro-esophageal reflux disease without esophagitis: Secondary | ICD-10-CM | POA: Diagnosis not present

## 2022-04-20 DIAGNOSIS — M4302 Spondylolysis, cervical region: Secondary | ICD-10-CM | POA: Diagnosis not present

## 2022-04-20 MED ORDER — ESCITALOPRAM OXALATE 20 MG PO TABS
ORAL_TABLET | ORAL | 3 refills | Status: DC
  Filled 2022-04-20: qty 90, 90d supply, fill #0
  Filled 2022-08-07: qty 90, 90d supply, fill #1
  Filled 2022-12-04: qty 90, 90d supply, fill #2
  Filled 2023-03-05: qty 90, 90d supply, fill #3

## 2022-05-29 DIAGNOSIS — E663 Overweight: Secondary | ICD-10-CM | POA: Diagnosis not present

## 2022-06-05 DIAGNOSIS — Z1389 Encounter for screening for other disorder: Secondary | ICD-10-CM | POA: Diagnosis not present

## 2022-06-05 DIAGNOSIS — Z01419 Encounter for gynecological examination (general) (routine) without abnormal findings: Secondary | ICD-10-CM | POA: Diagnosis not present

## 2022-06-05 DIAGNOSIS — Z1231 Encounter for screening mammogram for malignant neoplasm of breast: Secondary | ICD-10-CM | POA: Diagnosis not present

## 2022-06-05 DIAGNOSIS — Z130 Encounter for screening for diseases of the blood and blood-forming organs and certain disorders involving the immune mechanism: Secondary | ICD-10-CM | POA: Diagnosis not present

## 2022-08-07 ENCOUNTER — Other Ambulatory Visit (HOSPITAL_BASED_OUTPATIENT_CLINIC_OR_DEPARTMENT_OTHER): Payer: Self-pay

## 2022-08-08 ENCOUNTER — Other Ambulatory Visit (HOSPITAL_BASED_OUTPATIENT_CLINIC_OR_DEPARTMENT_OTHER): Payer: Self-pay

## 2022-08-08 MED ORDER — ALPRAZOLAM 0.5 MG PO TABS
0.5000 mg | ORAL_TABLET | Freq: Every day | ORAL | 3 refills | Status: AC
  Filled 2022-08-08: qty 30, 30d supply, fill #0
  Filled 2022-12-04: qty 30, 30d supply, fill #1

## 2022-08-08 MED ORDER — METHOCARBAMOL 750 MG PO TABS
750.0000 mg | ORAL_TABLET | Freq: Three times a day (TID) | ORAL | 0 refills | Status: DC
  Filled 2022-08-08: qty 30, 10d supply, fill #0

## 2022-09-11 ENCOUNTER — Other Ambulatory Visit: Payer: Self-pay

## 2022-09-11 MED ORDER — AZITHROMYCIN 250 MG PO TABS
ORAL_TABLET | ORAL | 0 refills | Status: AC
  Filled 2022-09-11: qty 6, 5d supply, fill #0

## 2022-09-19 ENCOUNTER — Other Ambulatory Visit: Payer: Self-pay

## 2022-09-19 MED ORDER — OSELTAMIVIR PHOSPHATE 75 MG PO CAPS
75.0000 mg | ORAL_CAPSULE | Freq: Every day | ORAL | 0 refills | Status: AC
  Filled 2022-09-19: qty 10, 10d supply, fill #0

## 2022-12-04 ENCOUNTER — Other Ambulatory Visit (HOSPITAL_COMMUNITY): Payer: Self-pay

## 2022-12-04 ENCOUNTER — Other Ambulatory Visit: Payer: Self-pay

## 2022-12-04 MED ORDER — METHOCARBAMOL 750 MG PO TABS
750.0000 mg | ORAL_TABLET | Freq: Three times a day (TID) | ORAL | 0 refills | Status: DC
  Filled 2022-12-04 – 2022-12-06 (×2): qty 30, 10d supply, fill #0

## 2022-12-06 ENCOUNTER — Other Ambulatory Visit (HOSPITAL_BASED_OUTPATIENT_CLINIC_OR_DEPARTMENT_OTHER): Payer: Self-pay

## 2022-12-06 ENCOUNTER — Other Ambulatory Visit (HOSPITAL_COMMUNITY): Payer: Self-pay

## 2022-12-06 ENCOUNTER — Other Ambulatory Visit: Payer: Self-pay

## 2022-12-06 DIAGNOSIS — K219 Gastro-esophageal reflux disease without esophagitis: Secondary | ICD-10-CM | POA: Diagnosis not present

## 2022-12-06 DIAGNOSIS — Z6832 Body mass index (BMI) 32.0-32.9, adult: Secondary | ICD-10-CM | POA: Diagnosis not present

## 2022-12-06 DIAGNOSIS — M4302 Spondylolysis, cervical region: Secondary | ICD-10-CM | POA: Diagnosis not present

## 2022-12-06 DIAGNOSIS — E6609 Other obesity due to excess calories: Secondary | ICD-10-CM | POA: Diagnosis not present

## 2022-12-06 MED ORDER — QSYMIA 3.75-23 MG PO CP24
1.0000 | ORAL_CAPSULE | Freq: Every day | ORAL | 0 refills | Status: AC
  Filled 2022-12-06: qty 14, 14d supply, fill #0

## 2022-12-06 MED ORDER — QSYMIA 7.5-46 MG PO CP24: 1.0000 | ORAL_CAPSULE | Freq: Every day | ORAL | 3 refills | Status: AC

## 2022-12-07 ENCOUNTER — Encounter (HOSPITAL_BASED_OUTPATIENT_CLINIC_OR_DEPARTMENT_OTHER): Payer: Self-pay

## 2022-12-07 ENCOUNTER — Other Ambulatory Visit (HOSPITAL_BASED_OUTPATIENT_CLINIC_OR_DEPARTMENT_OTHER): Payer: Self-pay

## 2022-12-10 ENCOUNTER — Other Ambulatory Visit (HOSPITAL_COMMUNITY): Payer: Self-pay

## 2022-12-10 ENCOUNTER — Other Ambulatory Visit (HOSPITAL_BASED_OUTPATIENT_CLINIC_OR_DEPARTMENT_OTHER): Payer: Self-pay

## 2022-12-10 MED ORDER — QSYMIA 7.5-46 MG PO CP24
1.0000 | ORAL_CAPSULE | Freq: Every day | ORAL | 3 refills | Status: AC
  Filled 2022-12-10: qty 30, 30d supply, fill #0

## 2022-12-10 MED ORDER — QSYMIA 3.75-23 MG PO CP24
1.0000 | ORAL_CAPSULE | Freq: Every day | ORAL | 0 refills | Status: AC
  Filled 2022-12-10: qty 14, 14d supply, fill #0

## 2022-12-12 ENCOUNTER — Other Ambulatory Visit (HOSPITAL_COMMUNITY): Payer: Self-pay

## 2023-01-21 ENCOUNTER — Other Ambulatory Visit (HOSPITAL_COMMUNITY): Payer: Self-pay

## 2023-01-28 ENCOUNTER — Other Ambulatory Visit (HOSPITAL_COMMUNITY): Payer: Self-pay

## 2023-01-29 ENCOUNTER — Other Ambulatory Visit (HOSPITAL_COMMUNITY): Payer: Self-pay

## 2023-02-01 ENCOUNTER — Other Ambulatory Visit (HOSPITAL_BASED_OUTPATIENT_CLINIC_OR_DEPARTMENT_OTHER): Payer: Self-pay

## 2023-02-01 MED ORDER — PHENTERMINE HCL 37.5 MG PO TABS
37.5000 mg | ORAL_TABLET | Freq: Every day | ORAL | 2 refills | Status: AC
  Filled 2023-02-01: qty 30, 30d supply, fill #0
  Filled 2023-03-05: qty 30, 30d supply, fill #1
  Filled 2023-04-13 (×2): qty 30, 30d supply, fill #2

## 2023-04-13 ENCOUNTER — Other Ambulatory Visit (HOSPITAL_BASED_OUTPATIENT_CLINIC_OR_DEPARTMENT_OTHER): Payer: Self-pay

## 2023-05-03 ENCOUNTER — Other Ambulatory Visit (HOSPITAL_BASED_OUTPATIENT_CLINIC_OR_DEPARTMENT_OTHER): Payer: Self-pay

## 2023-05-03 DIAGNOSIS — H52223 Regular astigmatism, bilateral: Secondary | ICD-10-CM | POA: Diagnosis not present

## 2023-05-03 DIAGNOSIS — H5213 Myopia, bilateral: Secondary | ICD-10-CM | POA: Diagnosis not present

## 2023-05-03 MED ORDER — ALPRAZOLAM 0.5 MG PO TABS
0.5000 mg | ORAL_TABLET | Freq: Every day | ORAL | 3 refills | Status: AC
  Filled 2023-05-03: qty 30, 30d supply, fill #0
  Filled 2023-08-02 – 2023-08-03 (×2): qty 30, 30d supply, fill #1

## 2023-05-03 MED ORDER — METHOCARBAMOL 750 MG PO TABS
750.0000 mg | ORAL_TABLET | Freq: Three times a day (TID) | ORAL | 0 refills | Status: DC
  Filled 2023-05-03: qty 30, 10d supply, fill #0

## 2023-05-06 ENCOUNTER — Other Ambulatory Visit (HOSPITAL_BASED_OUTPATIENT_CLINIC_OR_DEPARTMENT_OTHER): Payer: Self-pay

## 2023-06-05 ENCOUNTER — Other Ambulatory Visit (HOSPITAL_BASED_OUTPATIENT_CLINIC_OR_DEPARTMENT_OTHER): Payer: Self-pay

## 2023-06-05 MED ORDER — NITROFURANTOIN MONOHYD MACRO 100 MG PO CAPS
100.0000 mg | ORAL_CAPSULE | Freq: Two times a day (BID) | ORAL | 0 refills | Status: AC
  Filled 2023-06-05: qty 10, 5d supply, fill #0

## 2023-07-17 DIAGNOSIS — Z1389 Encounter for screening for other disorder: Secondary | ICD-10-CM | POA: Diagnosis not present

## 2023-07-24 DIAGNOSIS — R82998 Other abnormal findings in urine: Secondary | ICD-10-CM | POA: Diagnosis not present

## 2023-08-02 ENCOUNTER — Other Ambulatory Visit (HOSPITAL_COMMUNITY): Payer: Self-pay

## 2023-08-03 ENCOUNTER — Other Ambulatory Visit (HOSPITAL_BASED_OUTPATIENT_CLINIC_OR_DEPARTMENT_OTHER): Payer: Self-pay

## 2023-08-05 ENCOUNTER — Other Ambulatory Visit (HOSPITAL_COMMUNITY): Payer: Self-pay

## 2023-08-05 MED ORDER — DOXYCYCLINE HYCLATE 100 MG PO CAPS
100.0000 mg | ORAL_CAPSULE | Freq: Every day | ORAL | 0 refills | Status: AC
  Filled 2023-08-05: qty 30, 30d supply, fill #0

## 2023-08-05 MED ORDER — ESCITALOPRAM OXALATE 20 MG PO TABS
20.0000 mg | ORAL_TABLET | Freq: Every day | ORAL | 3 refills | Status: AC
Start: 2023-08-05 — End: ?
  Filled 2023-08-05 – 2023-08-10 (×2): qty 90, 90d supply, fill #0
  Filled 2023-11-27: qty 90, 90d supply, fill #1
  Filled 2024-03-12 – 2024-03-18 (×2): qty 90, 90d supply, fill #2
  Filled 2024-03-18: qty 90, 90d supply, fill #0
  Filled 2024-06-25: qty 90, 90d supply, fill #1

## 2023-08-05 MED ORDER — METHOCARBAMOL 750 MG PO TABS
750.0000 mg | ORAL_TABLET | Freq: Three times a day (TID) | ORAL | 0 refills | Status: DC
  Filled 2023-08-05 – 2023-08-10 (×2): qty 30, 10d supply, fill #0

## 2023-08-06 ENCOUNTER — Other Ambulatory Visit (HOSPITAL_COMMUNITY): Payer: Self-pay

## 2023-08-06 ENCOUNTER — Other Ambulatory Visit: Payer: Self-pay

## 2023-08-10 ENCOUNTER — Other Ambulatory Visit (HOSPITAL_BASED_OUTPATIENT_CLINIC_OR_DEPARTMENT_OTHER): Payer: Self-pay

## 2023-08-12 ENCOUNTER — Other Ambulatory Visit (HOSPITAL_COMMUNITY): Payer: Self-pay

## 2023-08-12 ENCOUNTER — Other Ambulatory Visit (HOSPITAL_BASED_OUTPATIENT_CLINIC_OR_DEPARTMENT_OTHER): Payer: Self-pay

## 2023-08-12 DIAGNOSIS — M2240 Chondromalacia patellae, unspecified knee: Secondary | ICD-10-CM | POA: Diagnosis not present

## 2023-08-12 DIAGNOSIS — R509 Fever, unspecified: Secondary | ICD-10-CM | POA: Diagnosis not present

## 2023-08-12 DIAGNOSIS — Z1389 Encounter for screening for other disorder: Secondary | ICD-10-CM | POA: Diagnosis not present

## 2023-08-12 DIAGNOSIS — J3489 Other specified disorders of nose and nasal sinuses: Secondary | ICD-10-CM | POA: Diagnosis not present

## 2023-08-12 DIAGNOSIS — M13169 Monoarthritis, not elsewhere classified, unspecified knee: Secondary | ICD-10-CM | POA: Diagnosis not present

## 2023-08-12 MED ORDER — PREDNISONE 20 MG PO TABS
20.0000 mg | ORAL_TABLET | Freq: Two times a day (BID) | ORAL | 0 refills | Status: AC
  Filled 2023-08-12: qty 10, 5d supply, fill #0

## 2023-08-13 ENCOUNTER — Other Ambulatory Visit (HOSPITAL_BASED_OUTPATIENT_CLINIC_OR_DEPARTMENT_OTHER): Payer: Self-pay

## 2023-08-13 MED ORDER — AZITHROMYCIN 250 MG PO TABS
ORAL_TABLET | ORAL | 0 refills | Status: AC
  Filled 2023-08-13: qty 6, 5d supply, fill #0

## 2023-08-21 ENCOUNTER — Other Ambulatory Visit (HOSPITAL_BASED_OUTPATIENT_CLINIC_OR_DEPARTMENT_OTHER): Payer: Self-pay

## 2023-08-21 MED ORDER — FLUCONAZOLE 150 MG PO TABS
150.0000 mg | ORAL_TABLET | Freq: Once | ORAL | 0 refills | Status: AC
  Filled 2023-08-21: qty 1, 1d supply, fill #0

## 2023-10-17 ENCOUNTER — Other Ambulatory Visit: Payer: Self-pay

## 2023-11-27 ENCOUNTER — Other Ambulatory Visit: Payer: Self-pay

## 2023-11-27 ENCOUNTER — Other Ambulatory Visit (HOSPITAL_COMMUNITY): Payer: Self-pay

## 2023-11-27 ENCOUNTER — Other Ambulatory Visit (HOSPITAL_BASED_OUTPATIENT_CLINIC_OR_DEPARTMENT_OTHER): Payer: Self-pay

## 2023-11-27 MED ORDER — METHOCARBAMOL 750 MG PO TABS
ORAL_TABLET | ORAL | 0 refills | Status: AC
  Filled 2023-11-27: qty 30, 10d supply, fill #0
  Filled 2023-12-05: qty 30, 30d supply, fill #0

## 2023-11-27 MED ORDER — ALPRAZOLAM 0.5 MG PO TABS
0.5000 mg | ORAL_TABLET | Freq: Every day | ORAL | 3 refills | Status: AC
  Filled 2023-11-27 – 2023-12-05 (×2): qty 30, 30d supply, fill #0
  Filled 2024-03-12 – 2024-03-18 (×2): qty 30, 30d supply, fill #1

## 2023-11-28 ENCOUNTER — Other Ambulatory Visit (HOSPITAL_COMMUNITY): Payer: Self-pay

## 2023-12-03 ENCOUNTER — Other Ambulatory Visit (HOSPITAL_BASED_OUTPATIENT_CLINIC_OR_DEPARTMENT_OTHER): Payer: Self-pay

## 2023-12-05 ENCOUNTER — Other Ambulatory Visit: Payer: Self-pay

## 2023-12-05 ENCOUNTER — Other Ambulatory Visit (HOSPITAL_BASED_OUTPATIENT_CLINIC_OR_DEPARTMENT_OTHER): Payer: Self-pay

## 2023-12-05 ENCOUNTER — Other Ambulatory Visit (HOSPITAL_COMMUNITY): Payer: Self-pay

## 2023-12-09 ENCOUNTER — Other Ambulatory Visit (HOSPITAL_COMMUNITY): Payer: Self-pay

## 2024-03-13 ENCOUNTER — Other Ambulatory Visit (HOSPITAL_COMMUNITY): Payer: Self-pay

## 2024-03-13 ENCOUNTER — Other Ambulatory Visit: Payer: Self-pay

## 2024-03-18 ENCOUNTER — Other Ambulatory Visit: Payer: Self-pay

## 2024-03-18 ENCOUNTER — Other Ambulatory Visit (HOSPITAL_COMMUNITY): Payer: Self-pay

## 2024-03-24 ENCOUNTER — Other Ambulatory Visit (HOSPITAL_BASED_OUTPATIENT_CLINIC_OR_DEPARTMENT_OTHER): Payer: Self-pay

## 2024-03-24 MED ORDER — MUPIROCIN 2 % EX OINT
1.0000 | TOPICAL_OINTMENT | Freq: Two times a day (BID) | CUTANEOUS | 1 refills | Status: AC
  Filled 2024-03-24: qty 44, 30d supply, fill #0

## 2024-03-24 MED ORDER — DOXYCYCLINE HYCLATE 100 MG PO CAPS
100.0000 mg | ORAL_CAPSULE | Freq: Two times a day (BID) | ORAL | 0 refills | Status: AC
  Filled 2024-03-24: qty 14, 7d supply, fill #0

## 2024-03-26 ENCOUNTER — Other Ambulatory Visit (HOSPITAL_BASED_OUTPATIENT_CLINIC_OR_DEPARTMENT_OTHER): Payer: Self-pay

## 2024-03-26 DIAGNOSIS — L089 Local infection of the skin and subcutaneous tissue, unspecified: Secondary | ICD-10-CM | POA: Diagnosis not present

## 2024-03-26 DIAGNOSIS — W57XXXA Bitten or stung by nonvenomous insect and other nonvenomous arthropods, initial encounter: Secondary | ICD-10-CM | POA: Diagnosis not present

## 2024-03-26 DIAGNOSIS — L03113 Cellulitis of right upper limb: Secondary | ICD-10-CM | POA: Diagnosis not present

## 2024-03-26 MED ORDER — FLUCONAZOLE 150 MG PO TABS
150.0000 mg | ORAL_TABLET | Freq: Every day | ORAL | 0 refills | Status: AC
  Filled 2024-03-26: qty 3, 3d supply, fill #0

## 2024-03-26 MED ORDER — SULFAMETHOXAZOLE-TRIMETHOPRIM 800-160 MG PO TABS
1.0000 | ORAL_TABLET | Freq: Two times a day (BID) | ORAL | 0 refills | Status: AC
  Filled 2024-03-26: qty 14, 7d supply, fill #0

## 2024-04-10 ENCOUNTER — Other Ambulatory Visit (HOSPITAL_COMMUNITY): Payer: Self-pay

## 2024-04-16 ENCOUNTER — Other Ambulatory Visit (HOSPITAL_COMMUNITY): Payer: Self-pay

## 2024-04-17 ENCOUNTER — Other Ambulatory Visit (HOSPITAL_COMMUNITY): Payer: Self-pay

## 2024-04-18 ENCOUNTER — Other Ambulatory Visit (HOSPITAL_COMMUNITY): Payer: Self-pay

## 2024-04-24 ENCOUNTER — Other Ambulatory Visit (HOSPITAL_COMMUNITY): Payer: Self-pay

## 2024-04-24 ENCOUNTER — Other Ambulatory Visit (HOSPITAL_BASED_OUTPATIENT_CLINIC_OR_DEPARTMENT_OTHER): Payer: Self-pay

## 2024-04-24 MED ORDER — METHOCARBAMOL 750 MG PO TABS
750.0000 mg | ORAL_TABLET | Freq: Three times a day (TID) | ORAL | 0 refills | Status: AC
  Filled 2024-04-24: qty 30, 10d supply, fill #0

## 2024-06-26 ENCOUNTER — Other Ambulatory Visit (HOSPITAL_COMMUNITY): Payer: Self-pay

## 2024-07-22 ENCOUNTER — Other Ambulatory Visit (HOSPITAL_BASED_OUTPATIENT_CLINIC_OR_DEPARTMENT_OTHER): Payer: Self-pay

## 2024-07-22 MED ORDER — FLUZONE 0.5 ML IM SUSY
0.5000 mL | PREFILLED_SYRINGE | Freq: Once | INTRAMUSCULAR | 0 refills | Status: AC
  Filled 2024-07-22: qty 0.5, 1d supply, fill #0

## 2024-08-26 DIAGNOSIS — Z130 Encounter for screening for diseases of the blood and blood-forming organs and certain disorders involving the immune mechanism: Secondary | ICD-10-CM | POA: Diagnosis not present

## 2024-08-26 DIAGNOSIS — Z1151 Encounter for screening for human papillomavirus (HPV): Secondary | ICD-10-CM | POA: Diagnosis not present

## 2024-08-26 DIAGNOSIS — Z1231 Encounter for screening mammogram for malignant neoplasm of breast: Secondary | ICD-10-CM | POA: Diagnosis not present

## 2024-08-26 DIAGNOSIS — Z1272 Encounter for screening for malignant neoplasm of vagina: Secondary | ICD-10-CM | POA: Diagnosis not present

## 2024-08-26 DIAGNOSIS — Z01419 Encounter for gynecological examination (general) (routine) without abnormal findings: Secondary | ICD-10-CM | POA: Diagnosis not present

## 2024-08-26 DIAGNOSIS — Z124 Encounter for screening for malignant neoplasm of cervix: Secondary | ICD-10-CM | POA: Diagnosis not present

## 2024-08-26 DIAGNOSIS — Z0142 Encounter for cervical smear to confirm findings of recent normal smear following initial abnormal smear: Secondary | ICD-10-CM | POA: Diagnosis not present

## 2024-09-10 DIAGNOSIS — R82998 Other abnormal findings in urine: Secondary | ICD-10-CM | POA: Diagnosis not present

## 2024-09-11 DIAGNOSIS — H52223 Regular astigmatism, bilateral: Secondary | ICD-10-CM | POA: Diagnosis not present

## 2024-09-17 ENCOUNTER — Other Ambulatory Visit (HOSPITAL_COMMUNITY): Payer: Self-pay

## 2024-09-17 MED ORDER — ESCITALOPRAM OXALATE 20 MG PO TABS
20.0000 mg | ORAL_TABLET | Freq: Every day | ORAL | 3 refills | Status: AC
  Filled 2024-09-17: qty 90, 90d supply, fill #0

## 2024-09-28 ENCOUNTER — Other Ambulatory Visit: Payer: Self-pay

## 2024-09-28 MED ORDER — OSELTAMIVIR PHOSPHATE 75 MG PO CAPS
75.0000 mg | ORAL_CAPSULE | Freq: Every day | ORAL | 0 refills | Status: AC
  Filled 2024-09-28: qty 10, 10d supply, fill #0
